# Patient Record
Sex: Female | Born: 1970 | Race: Black or African American | Hispanic: No | Marital: Married | Smoking: Never smoker
Health system: Southern US, Community
[De-identification: ages and names within clinical notes are randomized; demographics above are authoritative.]

## PROBLEM LIST (undated history)

## (undated) DIAGNOSIS — I10 Essential (primary) hypertension: Secondary | ICD-10-CM

## (undated) DIAGNOSIS — F329 Major depressive disorder, single episode, unspecified: Secondary | ICD-10-CM

## (undated) DIAGNOSIS — G4733 Obstructive sleep apnea (adult) (pediatric): Secondary | ICD-10-CM

## (undated) DIAGNOSIS — E119 Type 2 diabetes mellitus without complications: Secondary | ICD-10-CM

## (undated) DIAGNOSIS — F32A Depression, unspecified: Secondary | ICD-10-CM

## (undated) DIAGNOSIS — F419 Anxiety disorder, unspecified: Secondary | ICD-10-CM

## (undated) HISTORY — DX: Obstructive sleep apnea (adult) (pediatric): G47.33

## (undated) HISTORY — PX: ABDOMINAL HYSTERECTOMY: SHX81

## (undated) HISTORY — DX: Type 2 diabetes mellitus without complications: E11.9

## (undated) HISTORY — PX: BREAST BIOPSY: SHX20

## (undated) HISTORY — DX: Essential (primary) hypertension: I10

## (undated) HISTORY — PX: CHOLECYSTECTOMY: SHX55

## (undated) HISTORY — DX: Anxiety disorder, unspecified: F41.9

## (undated) HISTORY — DX: Depression, unspecified: F32.A

---
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
## 2018-09-06 ENCOUNTER — Other Ambulatory Visit: Payer: Self-pay | Admitting: Family Medicine

## 2018-09-06 DIAGNOSIS — Z1231 Encounter for screening mammogram for malignant neoplasm of breast: Secondary | ICD-10-CM

## 2018-09-12 ENCOUNTER — Encounter: Payer: Self-pay | Admitting: Neurology

## 2018-09-14 ENCOUNTER — Other Ambulatory Visit: Payer: Self-pay

## 2018-09-14 ENCOUNTER — Ambulatory Visit (INDEPENDENT_AMBULATORY_CARE_PROVIDER_SITE_OTHER): Payer: BC Managed Care – PPO | Admitting: Neurology

## 2018-09-14 ENCOUNTER — Encounter: Payer: Self-pay | Admitting: Neurology

## 2018-09-14 VITALS — BP 155/98 | HR 74 | Temp 96.0°F | Ht 69.0 in | Wt 303.0 lb

## 2018-09-14 DIAGNOSIS — Z9989 Dependence on other enabling machines and devices: Secondary | ICD-10-CM

## 2018-09-14 DIAGNOSIS — G4719 Other hypersomnia: Secondary | ICD-10-CM

## 2018-09-14 DIAGNOSIS — Z6841 Body Mass Index (BMI) 40.0 and over, adult: Secondary | ICD-10-CM

## 2018-09-14 DIAGNOSIS — G4733 Obstructive sleep apnea (adult) (pediatric): Secondary | ICD-10-CM

## 2018-09-14 NOTE — Progress Notes (Signed)
Subjective:    Patient ID: Brittany Giles is a 48 y.o. female.  HPI     [name], MD, PhD [facility] [address], [address] [address] [place], [place] [address] Dear Dr. Thomasena Edisollins, I saw you patient, Brittany Giles, upon your kind request in my sleep clinic today for initial consultation of her sleep disorder, in particular her prior diagnosis of sleep apnea for establishing care.  The patient is unaccompanied today.  As you know, Ms. Giles is a 48 year old right-handed woman with an underlying medical history of diabetes, anxiety, depression, hypertension, and morbid obesity with a BMI of over 40, who reports a diagnosis of obstructive sleep apnea since 2017.  Prior sleep study results Were reviewed today Through care everywhere.  She had a split-night sleep study on 01/16/2016 which showed a baseline AHI of 44/h, O2 nadir of 86%, she was titrated to 14 cm.  I reviewed your office note from 09/06/2018.  Her CBC with differential was unremarkable at the time.  She has been on metformin for diabetes and she takes generic Celexa 20 mg daily.  She has been on CPAP therapy.  She does not have a local DME provider yet.  I reviewed her compliance data from 08/16/2018 through 09/14/2018 which is a total of 30 days, during which time she used her CPAP machine every night with percent used days greater than 4 hours at 97%, indicating excellent compliance with an average usage of 5 hours and 44 minutes, residual AHI at goal at 3.3/h, leak is very high consistently with the 95th percentile at 120 L/min, pressure at 14 cm. Her Epworth sleepiness score is 19 out of 24, fatigue severity score is 46 out of 63. She reports that when she first started CPAP therapy it helped quite a bit.  She has had no recent supplies.  In fact, her full facemask she is using is about 48 years old she estimates.  She moved from [place] about a year ago.  [place] live in [place].  She lives with her husband  and 48-year-old son who has autism.  He sleeps in the bedroom with her, often in her bed with her, her husband sleeps in a different bedroom, husband uses a CPAP machine.  She reports a family history of sleep apnea in her brother and sister.  She recalls that she was told she had severe sleep apnea. 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  She drinks no caffeine on a daily basis.  She tries to hydrate well with water.  She reports that her most recent A1c was 11.  She has a follow-up appointment pending.  Bedtime is generally around 9 but she is often not asleep quickly.  Rise time when she teaches is around 530, currently no later than 7:15 AM when her son wakes up.  She does not have night to night nocturia.  Her weight has been fluctuating generally between 290 and 300 pounds.   Her Past Medical History Is Significant For: Past Medical History:  Diagnosis Date  . Anxiety   . Depression   . Diabetes mellitus without complication (HCC)    type 2  . Hypertension   . OSA on CPAP     Her Past Surgical History Is Significant For:   Her Family History Is Significant For: No family history on file.  Her Social History Is Significant For: Social History   Socioeconomic History  . Marital status: Married    Spouse name: Not  on file  . Number of children: Not on file  . Years of education: Not on file  . Highest education level: Not on file  Occupational History  . Not on file  Social Needs  . Financial resource strain: Not on file  . Food insecurity    Worry: Not on file    Inability: Not on file  . Transportation needs    Medical: Not on file    Non-medical: Not on file  Tobacco Use  . Smoking status: Never Smoker  . Smokeless tobacco: Never Used  Substance and Sexual Activity  . Alcohol use: Not Currently  . Drug use: Not Currently  . Sexual activity: Not on file  Lifestyle  . Physical activity    Days per week: Not on file    Minutes per session: Not on file  . Stress:  Not on file  Relationships  . Social Musicianconnections    Talks on phone: Not on file    Gets together: Not on file    Attends religious service: Not on file    Active member of club or organization: Not on file    Attends meetings of clubs or organizations: Not on file    Relationship status: Not on file  Other Topics Concern  . Not on file  Social History Narrative  . Not on file    Her Allergies Are:  No Known Allergies:   Her Current Medications Are:  Outpatient Encounter Medications as of 09/14/2018  Medication Sig  . Apple Cider Vinegar 500 MG TABS Take by mouth.  . citalopram (CELEXA) 20 MG tablet Take 20 mg by mouth daily.  Marland Kitchen. lisinopril (ZESTRIL) 20 MG tablet Take 20 mg by mouth daily.  . metFORMIN (GLUCOPHAGE) 500 MG tablet Take by mouth 2 (two) times daily with a meal.   No facility-administered encounter medications on file as of 09/14/2018.   :  Review of Systems:  Out of a complete 14 point review of systems, all are reviewed and negative with the exception of these symptoms as listed below: Review of Systems  Neurological:       Pt presents today. She states that she had a sleep study anbout 3 yrs ago. She started a CPAP and when she started initially it was very helpful. She states that she can feel something is different. She is needing to establish with sleep MD and also needs to be set up with a DME company.  Epworth Sleepiness Scale 0= would never doze 1= slight chance of dozing 2= moderate chance of dozing 3= high chance of dozing  Sitting and reading:3 Watching TV:2 Sitting inactive in a public place (ex. Theater or meeting):2 As a passenger in a car for an hour without a break:3 Lying down to rest in the afternoon:3 Sitting and talking to someone:2 Sitting quietly after lunch (no alcohol):2 In a car, while stopped in traffic:2 Total:19     Objective:  Neurological Exam  Physical Exam Physical Examination:   Vitals:   09/14/18 1455  BP: (!)  155/98  Pulse: 74  Temp: (!) 96 F (35.6 C)   General Examination: The patient is a very pleasant 48 y.o. female in no acute distress. She appears well-developed and well-nourished and well groomed.   HEENT: Normocephalic, atraumatic, pupils are equal, round and reactive to light and accommodation. Extraocular tracking is good without limitation to gaze excursion or nystagmus noted. Normal smooth pursuit is noted. Hearing is grossly intact. face is symmetric with normal  facial animation and normal facial sensation. Speech is clear with no dysarthria noted. There is no hypophonia. There is no lip, neck/head, jaw or voice tremor. Neck is supple with full range of passive and active motion. There are no carotid bruits on auscultation. Oropharynx exam reveals: mild mouth dryness, adequate dental hygiene and moderate airway crowding, due to Longer uvula, tonsils of 3+ on the right side and 2+ on the left, Mallampati is class I, tongue protrudes centrally in palate elevates symmetrically.   Chest: Clear to auscultation without wheezing, rhonchi or crackles noted.  Heart: S1+S2+0, regular and normal without murmurs, rubs or gallops noted.   Abdomen: Soft, non-tender and non-distended with normal bowel sounds appreciated on auscultation.  Extremities: There is no pitting edema in the distal lower extremities bilaterally. Pedal pulses are intact.  Skin: Warm and dry without trophic changes noted.  Musculoskeletal: exam reveals no obvious joint deformities, tenderness or joint swelling or erythema.   Neurologically:  Mental status: The patient is awake, alert and oriented in all 4 spheres. Her immediate and remote memory, attention, language skills and fund of knowledge are appropriate. There is no evidence of aphasia, agnosia, apraxia or anomia. Speech is clear with normal prosody and enunciation. Thought process is linear. Mood is normal and affect is normal.  Cranial nerves II - XII are as described  above under HEENT exam. In addition: shoulder shrug is normal with equal shoulder height noted. Motor exam: Normal bulk, strength and tone is noted. There is no drift, tremor or rebound. Romberg is negative. Fine motor skills and coordination: intact with normal finger taps, normal hand movements, normal rapid alternating patting, normal foot taps and normal foot agility.  Cerebellar testing: No dysmetria or intention tremor on finger to nose testing.  There is no truncal or gait ataxia.  Sensory exam: intact to light touch in the upper and lower extremities.  Gait, station and balance: She stands easily. No veering to one side is noted. No leaning to one side is noted. Posture is age-appropriate and stance is narrow based. Gait shows normal stride length and normal pace. No problems turning are noted. Slight genu valgus bilat.  Assessment and Plan:  In summary, Pearly Bartosik is a very pleasant 48 y.o.-year old female with an underlying medical history of diabetes, anxiety, depression, hypertension, and morbid obesity with a BMI of over 48, who Presents for evaluation of her prior diagnosis of obstructive sleep apnea.  She was diagnosed with severe sleep apnea in November 2017, she had a split-night sleep study.  She has been compliant with her CPAP and is commended for her treatment adherence, sleep apnea score is currently at goal below 5/h but she has a very high leak.  She does need new supplies and needs to establish with a DME company locally.  She and her family moved from [place] about a year ago.  We talked about the importance of compliance with CPAP therapy for sleep apnea management.  We also talked about the importance of good diabetes control and healthy lifestyle in general, weight management in particular.  She is encouraged to talk to about potentially seeing a weight management specialist.  She has an appointment pending with you to discuss her A1c and further management of her diabetes as  I understand. I had a long chat with the patient about my findings and the diagnosis of OSA, its prognosis and treatment options. We talked about medical treatments, surgical interventions and non-pharmacological approaches. I explained in particular  the risks and ramifications of untreated moderate to severe OSA, especially with respect to developing cardiovascular disease down the Road, including congestive heart failure, difficult to treat hypertension, cardiac arrhythmias, or stroke. Even type 2 diabetes has, in part, been linked to untreated OSA. Symptoms of untreated OSA include daytime sleepiness, memory problems, mood irritability and mood disorder such as depression and anxiety, lack of energy, as well as recurrent headaches, especially morning headaches.  I recommended the following at this time: Continue CPAP therapy at the current settings, we will try to get her supplies through a local DME company.  She is advised to try to make enough time for sleep, 7 to 8 hours if possible. She has a special needs child 17(9 yo son has autism and sleeps with her).  I Suggested a routine follow-up in 6 months, sooner if needed.  I answered all her questions today and she was in agreement. Thank you very much for allowing me to participate in the care of this nice patient. If I can be of any further assistance to you please do not hesitate to call me at [phone].  Sincerely,   Huston FoleySaima Tysen Roesler, MD, PhD

## 2018-09-14 NOTE — Patient Instructions (Signed)
Please continue using your CPAP regularly. While your insurance requires that you use CPAP at least 4 hours each night on 70% of the nights, I recommend, that you not skip any nights and use it throughout the night if you can. Getting used to CPAP and staying with the treatment long term does take time and patience and discipline. Untreated obstructive sleep apnea when it is moderate to severe can have an adverse impact on cardiovascular health and raise her risk for heart disease, arrhythmias, hypertension, congestive heart failure, stroke and diabetes. Untreated obstructive sleep apnea causes sleep disruption, nonrestorative sleep, and sleep deprivation. This can have an impact on your day to day functioning and cause daytime sleepiness and impairment of cognitive function, memory loss, mood disturbance, and problems focussing. Using CPAP regularly can improve these symptoms.  I will send your CPAP supply order to [facility]. Please call us mid next week, if you don't hear from them. The address for [facility] is: [address] [phone].

## 2018-10-16 ENCOUNTER — Ambulatory Visit
Admission: RE | Admit: 2018-10-16 | Discharge: 2018-10-16 | Disposition: A | Discharge status: Home / Self Care | Payer: BC Managed Care – PPO | Source: Ambulatory Visit | Attending: Family Medicine | Admitting: Family Medicine

## 2018-10-16 ENCOUNTER — Other Ambulatory Visit: Payer: Self-pay

## 2018-10-16 DIAGNOSIS — Z1231 Encounter for screening mammogram for malignant neoplasm of breast: Secondary | ICD-10-CM

## 2019-03-29 ENCOUNTER — Ambulatory Visit: Payer: BC Managed Care – PPO | Admitting: Neurology

## 2021-04-06 ENCOUNTER — Other Ambulatory Visit: Payer: Self-pay | Admitting: Family Medicine

## 2021-04-06 DIAGNOSIS — N644 Mastodynia: Secondary | ICD-10-CM

## 2021-04-11 ENCOUNTER — Other Ambulatory Visit: Payer: Self-pay | Admitting: Family Medicine

## 2021-04-11 DIAGNOSIS — N644 Mastodynia: Secondary | ICD-10-CM

## 2021-05-13 ENCOUNTER — Other Ambulatory Visit: Payer: BC Managed Care – PPO

## 2021-05-28 ENCOUNTER — Ambulatory Visit: Payer: Self-pay

## 2021-05-28 ENCOUNTER — Ambulatory Visit
Admission: RE | Admit: 2021-05-28 | Discharge: 2021-05-28 | Disposition: A | Discharge status: Home / Self Care | Payer: BC Managed Care – PPO | Source: Ambulatory Visit | Attending: Family Medicine | Admitting: Family Medicine

## 2021-05-28 ENCOUNTER — Other Ambulatory Visit: Payer: Self-pay

## 2021-08-08 ENCOUNTER — Ambulatory Visit (HOSPITAL_COMMUNITY)
Admission: EM | Admit: 2021-08-08 | Discharge: 2021-08-08 | Disposition: A | Discharge status: Home / Self Care | Payer: BC Managed Care – PPO | Attending: Internal Medicine | Admitting: Internal Medicine

## 2021-08-08 ENCOUNTER — Encounter (HOSPITAL_COMMUNITY): Payer: Self-pay | Admitting: *Deleted

## 2021-08-08 ENCOUNTER — Other Ambulatory Visit: Payer: Self-pay

## 2021-08-08 DIAGNOSIS — J020 Streptococcal pharyngitis: Secondary | ICD-10-CM | POA: Diagnosis not present

## 2021-08-08 LAB — POCT RAPID STREP A, ED / UC: Streptococcus, Group A Screen (Direct): POSITIVE — AB

## 2021-08-08 MED ORDER — AMOXICILLIN 250 MG/5ML PO SUSR: 500.0000 mg | Freq: Two times a day (BID) | ORAL | 0 refills | Status: AC

## 2021-08-08 MED ORDER — AMOXICILLIN 500 MG PO CAPS: 500.0000 mg | ORAL_CAPSULE | Freq: Two times a day (BID) | ORAL | 0 refills | Status: DC

## 2021-08-08 NOTE — Discharge Instructions (Signed)
Your rapid strep test today was positive  Take amoxicillin twice a day for the next 10 days and ideally should start to see improvement within 24 to 40 hours of medication use  Attempt salt water gargles, throat lozenges, warm liquids, soft foods, teaspoons of honey for additional comfort  You may attempt over-the-counter Imodium to help slow down her diarrhea, be mindful of overuse of this medicine will make you constipated  May use ibuprofen every 8 hours as needed in addition to Tylenol for additional comfort  You may follow-up at urgent care as needed

## 2021-08-08 NOTE — ED Triage Notes (Signed)
Pt reports sore throat and diarrhea for 3 days.

## 2021-08-08 NOTE — ED Provider Notes (Signed)
[facility]    CSN: [id number] Arrival date & time: 08/08/21  1720      History   Chief Complaint Chief Complaint  Patient presents with   Sore Throat   Diarrhea    HPI Brittany Giles is a 51 y.o. female.   Presents with sore throat, chills, bilateral ear pain, throat discomfort and diarrhea for 3 days.  Episode of diarrhea today.  Has attempted use of Coricidin which has been minimally helpful.  Tolerating small amounts of food and liquids.  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  History of diabetes, hypertension, sleep apnea, depression anxiety.    Past Medical History:  Diagnosis Date   Anxiety    Depression    Diabetes mellitus without complication (HCC)    type 2   Hypertension    OSA on CPAP     There are no problems to display for this patient.   Past Surgical History:  Procedure Laterality Date   ABDOMINAL HYSTERECTOMY     BREAST BIOPSY Left    2010 benign   CESAREAN SECTION  2011   CHOLECYSTECTOMY      OB History   No obstetric history on file.      Home Medications    Prior to Admission medications   Medication Sig Start Date End Date Taking? Authorizing Provider  Apple Cider Vinegar 500 MG TABS Take by mouth.    [provider]  citalopram (CELEXA) 20 MG tablet Take 20 mg by mouth daily.    [provider]  lisinopril (ZESTRIL) 20 MG tablet Take 20 mg by mouth daily.    [provider]  metFORMIN (GLUCOPHAGE) 500 MG tablet Take by mouth 2 (two) times daily with a meal.    [provider]    Family History Family History  Problem Relation Age of Onset   Breast cancer Maternal Grandmother 26    Social History Social History   Tobacco Use   Smoking status: Never   Smokeless tobacco: Never  Substance Use Topics   Alcohol use: Not Currently   Drug use: Not Currently     Allergies   Patient has no known allergies.   Review of Systems Review of Systems   Constitutional:  Positive for chills. Negative for activity change, appetite change, diaphoresis, fatigue, fever and unexpected weight change.  HENT:  Positive for ear pain and sore throat. Negative for congestion, dental problem, drooling, ear discharge, facial swelling, hearing loss, mouth sores, nosebleeds, postnasal drip, rhinorrhea, sinus pressure, sinus pain, sneezing, tinnitus, trouble swallowing and voice change.   Respiratory: Negative.    Cardiovascular: Negative.   Gastrointestinal:  Positive for diarrhea. Negative for abdominal distention, abdominal pain, anal bleeding, blood in stool, constipation, nausea, rectal pain and vomiting.  Skin: Negative.     Physical Exam Triage Vital Signs ED Triage Vitals  Enc Vitals Group     BP 08/08/21 1728 140/75     Pulse Rate 08/08/21 1728 98     Resp 08/08/21 1728 18     Temp 08/08/21 1728 98.9 F (37.2 C)     Temp src --      SpO2 08/08/21 1728 98 %     Weight --      Height --      Head Circumference --      Peak Flow --      Pain Score 08/08/21 1726 8     Pain Loc --  Pain Edu? --      Excl. in GC? --    No data found.  Updated Vital Signs BP 140/75   Pulse 98   Temp 98.9 F (37.2 C)   Resp 18   SpO2 98%   Visual Acuity Right Eye Distance:   Left Eye Distance:   Bilateral Distance:    Right Eye Near:   Left Eye Near:    Bilateral Near:     Physical Exam Constitutional:      Appearance: Normal appearance. She is well-developed.  HENT:     Head: Normocephalic.     Right Ear: Tympanic membrane and ear canal normal.     Left Ear: Tympanic membrane and ear canal normal.     Nose: Congestion present.     Mouth/Throat:     Mouth: Mucous membranes are moist.     Pharynx: Oropharynx is clear.     Tonsils: Tonsillar exudate present. 2+ on the right. 2+ on the left.  Eyes:     Extraocular Movements: Extraocular movements intact.  Cardiovascular:     Rate and Rhythm: Normal rate and regular rhythm.      Pulses: Normal pulses.     Heart sounds: Normal heart sounds.  Pulmonary:     Effort: Pulmonary effort is normal.     Breath sounds: Normal breath sounds.  Musculoskeletal:        General: Normal range of motion.     Cervical back: Normal range of motion.  Lymphadenopathy:     Cervical: Cervical adenopathy present.  Skin:    General: Skin is warm and dry.  Neurological:     General: No focal deficit present.     Mental Status: She is alert and oriented to person, place, and time.  Psychiatric:        Mood and Affect: Mood normal.        Behavior: Behavior normal.     UC Treatments / Results  Labs (all labs ordered are listed, but only abnormal results are displayed) Labs Reviewed  POCT RAPID STREP A, ED / UC - Abnormal; Notable for the following components:      Result Value   Streptococcus, Group A Screen (Direct) POSITIVE (*)    All other components within normal limits    EKG   Radiology No results found.  Procedures Procedures (including critical care time)  Medications Ordered in UC Medications - No data to display  Initial Impression / Assessment and Plan / UC Course  I have reviewed the triage vital signs and the nursing notes.  Pertinent labs & imaging results that were available during my care of the patient were reviewed by me and considered in my medical decision making (see chart for details).  Pharyngitis  Confirmed by rapid test, discussed with patient, amoxicillin 10-day course prescribed, may use additional over-the-counter medications for supportive care, recommend over-the-counter Imodium to help slow diarrhea, may follow-up with urgent care as needed for persistent or reoccurring symptoms Final Clinical Impressions(s) / UC Diagnoses   Final diagnoses:  None   Discharge Instructions   None    ED Prescriptions   None    PDMP not reviewed this encounter.   Valinda Hoar, NP 08/08/21 1815

## 2022-10-11 IMAGING — MG DIGITAL DIAGNOSTIC BILAT W/ TOMO W/ CAD
8 of 14 series · 8 of 40 positions shown · non-contrast
Comparison: Previous exam(s).

ACR Breast Density Category a: The breast tissue is almost entirely
fatty.

CLINICAL DATA: Nonfocal left breast pain beginning 2 months ago and
lasting 2 weeks. She has not had any pain in the past 1.5 months.

EXAM:
DIGITAL DIAGNOSTIC BILATERAL MAMMOGRAM WITH TOMOSYNTHESIS AND CAD
TECHNIQUE: Bilateral digital diagnostic mammography and breast tomosynthesis
was performed. The images were evaluated with computer-aided
detection.

[R XCCL synth-2D]
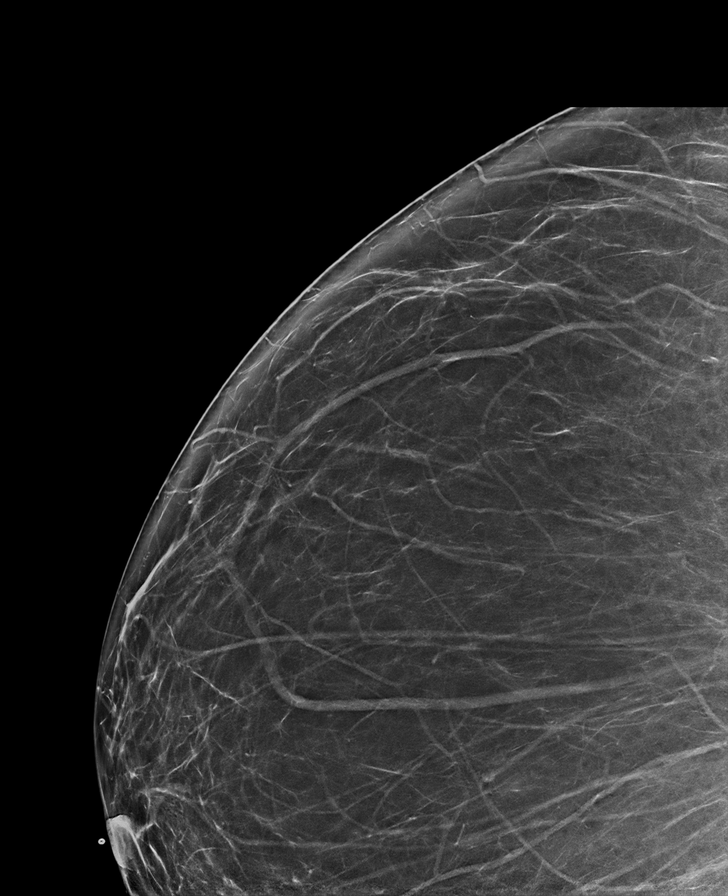

[L CC synth-2D]
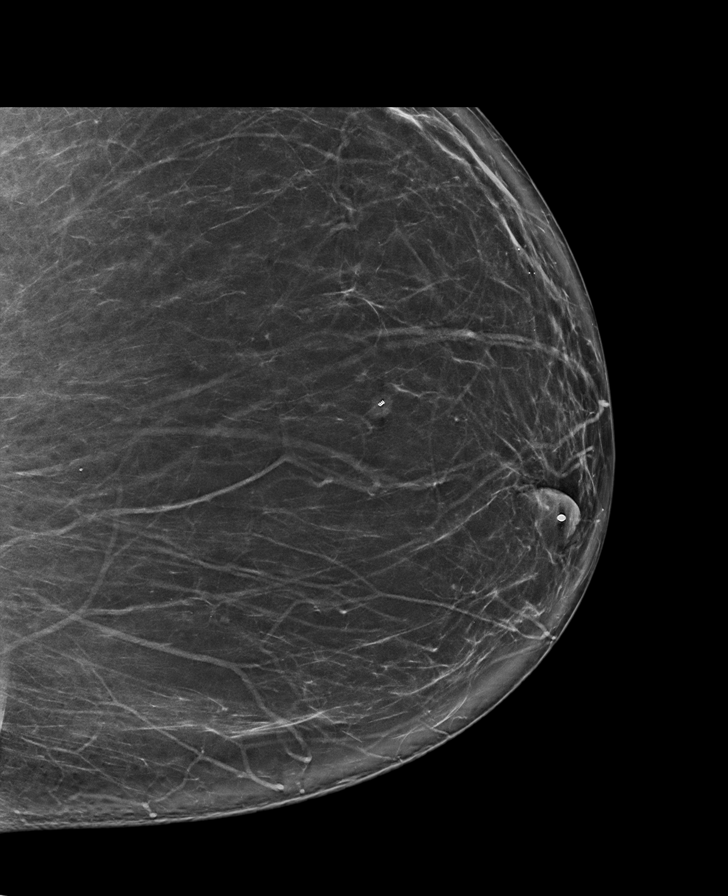

[R MLO synth-2D (1 of 2)]
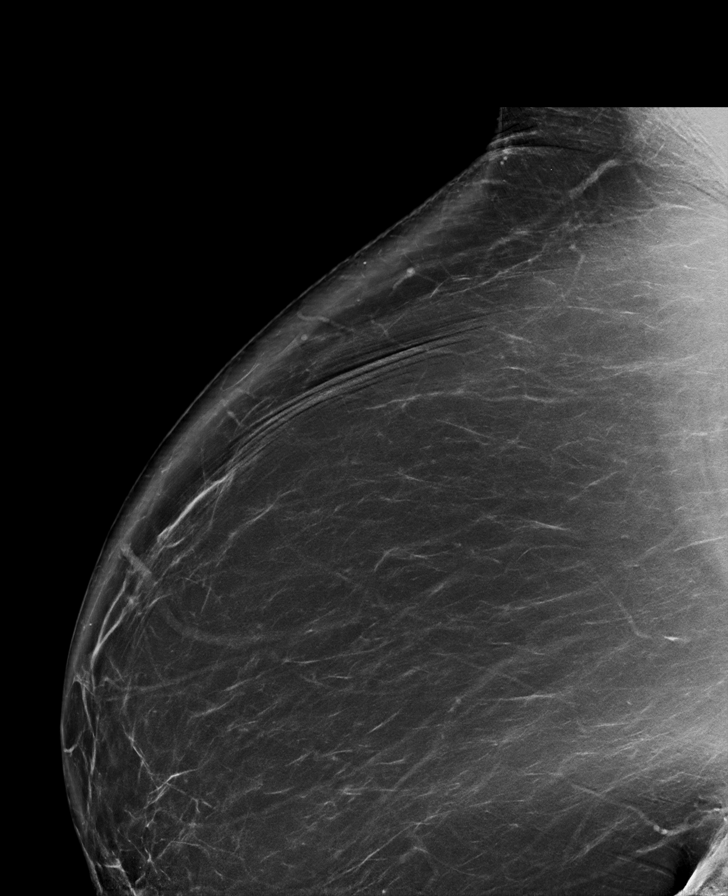

[R CC synth-2D]
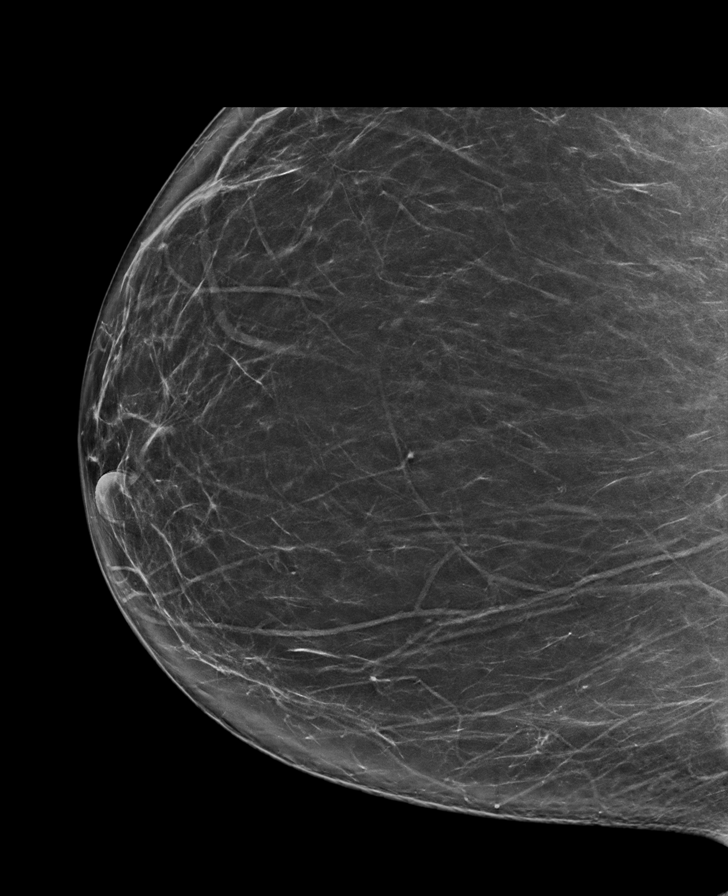

[L MLO synth-2D]
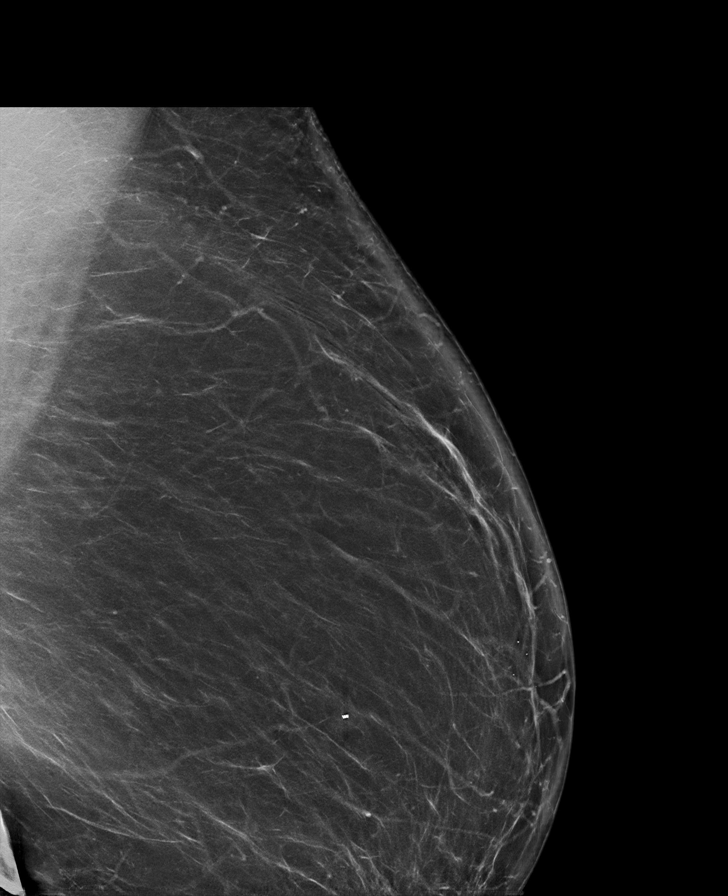

[R MLO synth-2D (2 of 2)]
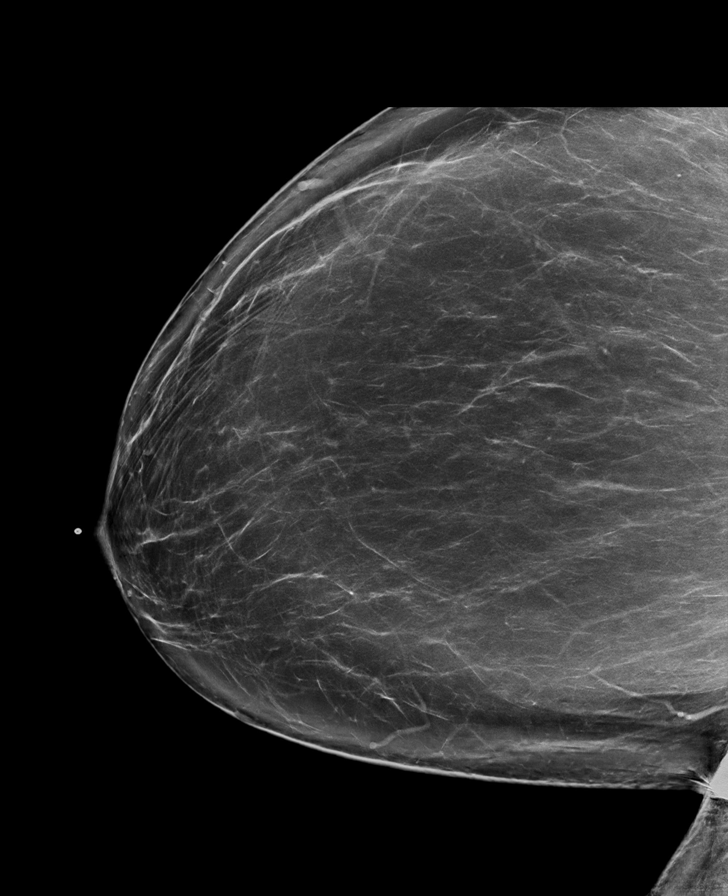

[L XCCL synth-2D]
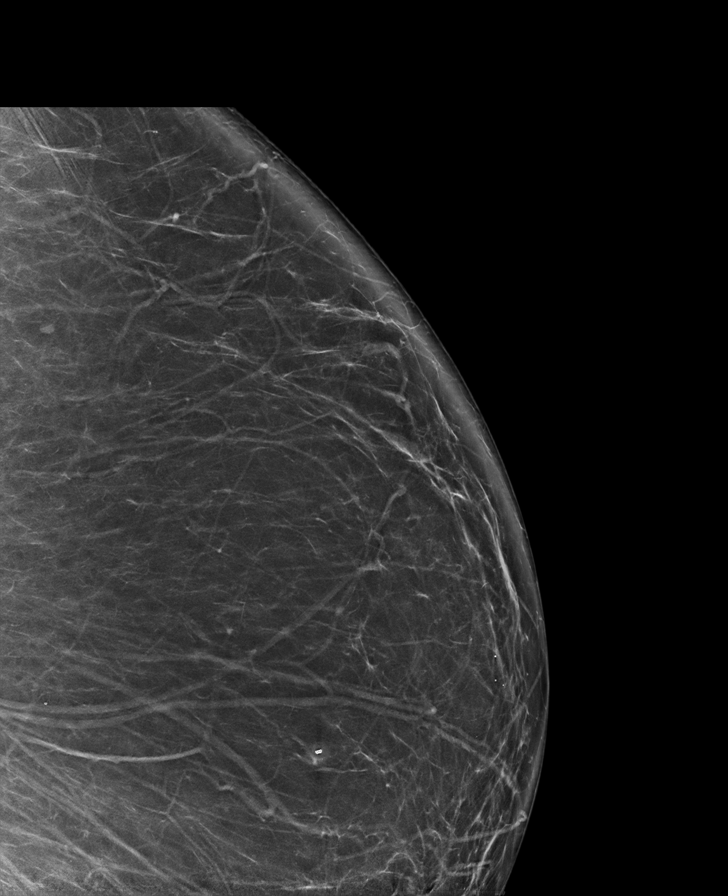

[R MLO tomo · tomo slice 55/108.0]
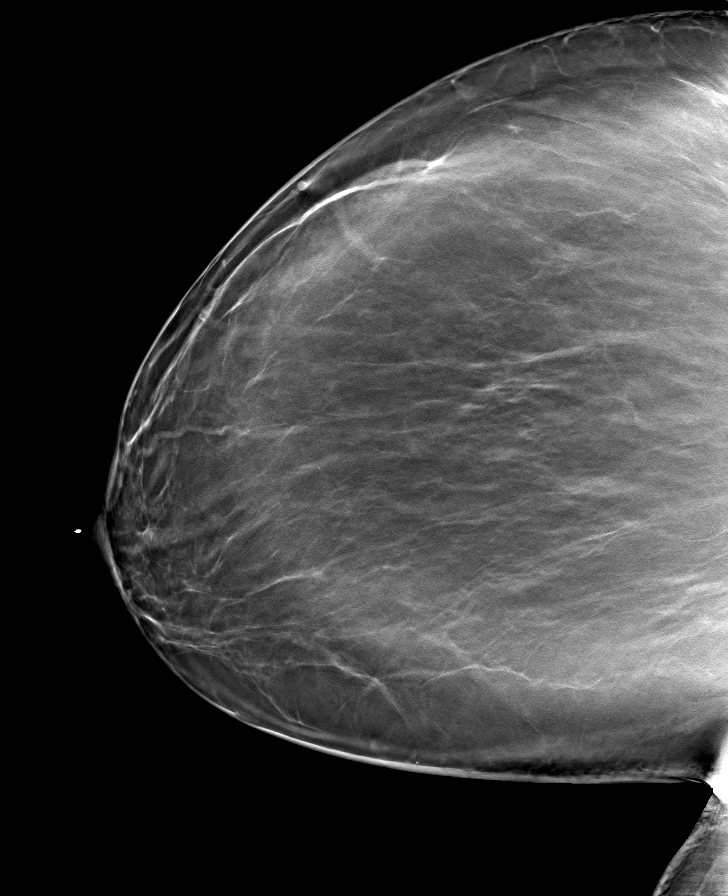

[8 of 40 positions shown; findings below may reference images not displayed]

FINDINGS: Stable mammographic appearance of the breasts with no findings
suspicious for malignancy in either breast.
IMPRESSION: No evidence of malignancy.

RECOMMENDATION:
Bilateral screening mammogram in year.

I have discussed the findings and recommendations with the patient.
If applicable, a reminder letter will be sent to the patient
regarding the next appointment.

BI-RADS CATEGORY  1: Negative.

## 2022-12-26 ENCOUNTER — Encounter (HOSPITAL_COMMUNITY): Payer: Self-pay | Admitting: Pharmacy Technician

## 2022-12-26 ENCOUNTER — Other Ambulatory Visit: Payer: Self-pay

## 2022-12-26 ENCOUNTER — Inpatient Hospital Stay (HOSPITAL_COMMUNITY)
Admission: EM | Admit: 2022-12-26 | Discharge: 2022-12-29 | DRG: 175 | Disposition: A | Discharge status: Home / Self Care | Payer: BC Managed Care – PPO | Attending: Internal Medicine | Admitting: Internal Medicine

## 2022-12-26 ENCOUNTER — Emergency Department (HOSPITAL_COMMUNITY): Payer: BC Managed Care – PPO

## 2022-12-26 DIAGNOSIS — E1165 Type 2 diabetes mellitus with hyperglycemia: Secondary | ICD-10-CM | POA: Diagnosis present

## 2022-12-26 DIAGNOSIS — Z79899 Other long term (current) drug therapy: Secondary | ICD-10-CM

## 2022-12-26 DIAGNOSIS — E119 Type 2 diabetes mellitus without complications: Secondary | ICD-10-CM

## 2022-12-26 DIAGNOSIS — F32A Depression, unspecified: Secondary | ICD-10-CM | POA: Diagnosis present

## 2022-12-26 DIAGNOSIS — E1159 Type 2 diabetes mellitus with other circulatory complications: Secondary | ICD-10-CM | POA: Diagnosis not present

## 2022-12-26 DIAGNOSIS — E1169 Type 2 diabetes mellitus with other specified complication: Secondary | ICD-10-CM | POA: Diagnosis present

## 2022-12-26 DIAGNOSIS — Z6841 Body Mass Index (BMI) 40.0 and over, adult: Secondary | ICD-10-CM | POA: Diagnosis not present

## 2022-12-26 DIAGNOSIS — I152 Hypertension secondary to endocrine disorders: Secondary | ICD-10-CM | POA: Diagnosis present

## 2022-12-26 DIAGNOSIS — G4733 Obstructive sleep apnea (adult) (pediatric): Secondary | ICD-10-CM | POA: Insufficient documentation

## 2022-12-26 DIAGNOSIS — J9601 Acute respiratory failure with hypoxia: Secondary | ICD-10-CM | POA: Diagnosis present

## 2022-12-26 DIAGNOSIS — Z7984 Long term (current) use of oral hypoglycemic drugs: Secondary | ICD-10-CM | POA: Diagnosis not present

## 2022-12-26 DIAGNOSIS — Z91148 Patient's other noncompliance with medication regimen for other reason: Secondary | ICD-10-CM | POA: Diagnosis not present

## 2022-12-26 DIAGNOSIS — T383X6A Underdosing of insulin and oral hypoglycemic [antidiabetic] drugs, initial encounter: Secondary | ICD-10-CM | POA: Diagnosis present

## 2022-12-26 DIAGNOSIS — R0602 Shortness of breath: Secondary | ICD-10-CM | POA: Diagnosis present

## 2022-12-26 DIAGNOSIS — Z90710 Acquired absence of both cervix and uterus: Secondary | ICD-10-CM | POA: Diagnosis not present

## 2022-12-26 DIAGNOSIS — F419 Anxiety disorder, unspecified: Secondary | ICD-10-CM | POA: Diagnosis present

## 2022-12-26 DIAGNOSIS — I2609 Other pulmonary embolism with acute cor pulmonale: Secondary | ICD-10-CM | POA: Diagnosis not present

## 2022-12-26 DIAGNOSIS — I2699 Other pulmonary embolism without acute cor pulmonale: Principal | ICD-10-CM | POA: Diagnosis present

## 2022-12-26 DIAGNOSIS — Z86711 Personal history of pulmonary embolism: Secondary | ICD-10-CM | POA: Diagnosis not present

## 2022-12-26 DIAGNOSIS — R000 Tachycardia, unspecified: Secondary | ICD-10-CM | POA: Diagnosis present

## 2022-12-26 DIAGNOSIS — E876 Hypokalemia: Secondary | ICD-10-CM | POA: Diagnosis present

## 2022-12-26 LAB — BASIC METABOLIC PANEL
Anion gap: 11 (ref 5–15)
BUN: 12 mg/dL (ref 6–20)
CO2: 25 mmol/L (ref 22–32)
Calcium: 9.5 mg/dL (ref 8.9–10.3)
Chloride: 100 mmol/L (ref 98–111)
Creatinine, Ser: 0.93 mg/dL (ref 0.44–1.00)
GFR, Estimated: >60 mL/min (ref >60)
Glucose, Bld: 227 mg/dL — ABNORMAL HIGH (ref 70–99)
Potassium: 3.4 mmol/L — ABNORMAL LOW (ref 3.5–5.1)
Sodium: 136 mmol/L (ref 135–145)

## 2022-12-26 LAB — LACTIC ACID, PLASMA: Lactic Acid, Venous: 1.8 mmol/L (ref 0.5–1.9)

## 2022-12-26 LAB — CBC
HCT: 39.5 % (ref 36.0–46.0)
Hemoglobin: 12.4 g/dL (ref 12.0–15.0)
MCH: 28.1 pg (ref 26.0–34.0)
MCHC: 31.4 g/dL (ref 30.0–36.0)
MCV: 89.4 fL (ref 80.0–100.0)
Platelets: 208 10*3/uL (ref 150–400)
RBC: 4.42 MIL/uL (ref 3.87–5.11)
RDW: 15.3 % (ref 11.5–15.5)
WBC: 9 10*3/uL (ref 4.0–10.5)
nRBC: 0 % (ref 0.0–0.2)

## 2022-12-26 LAB — TROPONIN I (HIGH SENSITIVITY)
Troponin I (High Sensitivity): 5 ng/L (ref <18)
Troponin I (High Sensitivity): 26 ng/L — ABNORMAL HIGH (ref <18)
Troponin I (High Sensitivity): 72 ng/L — ABNORMAL HIGH (ref <18)

## 2022-12-26 LAB — APTT: aPTT: 25 s (ref 24–36)

## 2022-12-26 LAB — BRAIN NATRIURETIC PEPTIDE
B Natriuretic Peptide: 18.8 pg/mL (ref 0.0–100.0)
B Natriuretic Peptide: 37.8 pg/mL (ref 0.0–100.0)

## 2022-12-26 LAB — PROTIME-INR
INR: 1.1 (ref 0.8–1.2)
Prothrombin Time: 13.9 s (ref 11.4–15.2)

## 2022-12-26 LAB — GLUCOSE, CAPILLARY: Glucose-Capillary: 185 mg/dL — ABNORMAL HIGH (ref 70–99)

## 2022-12-26 LAB — CBG MONITORING, ED: Glucose-Capillary: 178 mg/dL — ABNORMAL HIGH (ref 70–99)

## 2022-12-26 LAB — MRSA NEXT GEN BY PCR, NASAL: MRSA by PCR Next Gen: NOT DETECTED

## 2022-12-26 MED ORDER — ONDANSETRON HCL 4 MG/2ML IJ SOLN: 4.0000 mg | Freq: Four times a day (QID) | INTRAMUSCULAR | Status: DC | PRN

## 2022-12-26 MED ORDER — OXYCODONE HCL 5 MG PO TABS: 5.0000 mg | ORAL_TABLET | ORAL | Status: DC | PRN

## 2022-12-26 MED ORDER — ONDANSETRON HCL 4 MG PO TABS: 4.0000 mg | ORAL_TABLET | Freq: Four times a day (QID) | ORAL | Status: DC | PRN

## 2022-12-26 MED ORDER — ACETAMINOPHEN 500 MG PO TABS
1000.0000 mg | ORAL_TABLET | Freq: Four times a day (QID) | ORAL | Status: DC | PRN
  Administered 2022-12-26 – 2022-12-28 (×2): 1000 mg via ORAL
  Filled 2022-12-26 (×2): qty 2

## 2022-12-26 MED ORDER — HEPARIN (PORCINE) 25000 UT/250ML-% IV SOLN
1500.0000 [IU]/h | INTRAVENOUS | Status: DC
  Administered 2022-12-26: 1800 [IU]/h via INTRAVENOUS
  Administered 2022-12-27: 1500 [IU]/h via INTRAVENOUS
  Filled 2022-12-26 (×2): qty 250

## 2022-12-26 MED ORDER — SENNOSIDES-DOCUSATE SODIUM 8.6-50 MG PO TABS: 1.0000 | ORAL_TABLET | Freq: Every evening | ORAL | Status: DC | PRN

## 2022-12-26 MED ORDER — INSULIN ASPART 100 UNIT/ML IJ SOLN
0.0000 [IU] | Freq: Three times a day (TID) | INTRAMUSCULAR | Status: DC
  Administered 2022-12-27: 1 [IU] via SUBCUTANEOUS
  Administered 2022-12-27: 3 [IU] via SUBCUTANEOUS
  Administered 2022-12-27: 1 [IU] via SUBCUTANEOUS
  Administered 2022-12-28: 2 [IU] via SUBCUTANEOUS
  Administered 2022-12-28: 1 [IU] via SUBCUTANEOUS
  Administered 2022-12-28 – 2022-12-29 (×2): 2 [IU] via SUBCUTANEOUS
  Filled 2022-12-26: qty 0.09

## 2022-12-26 MED ORDER — CHLORHEXIDINE GLUCONATE CLOTH 2 % EX PADS
6.0000 | MEDICATED_PAD | Freq: Every day | CUTANEOUS | Status: DC
  Administered 2022-12-26 – 2022-12-29 (×3): 6 via TOPICAL

## 2022-12-26 MED ORDER — ACETAMINOPHEN 650 MG RE SUPP: 650.0000 mg | Freq: Four times a day (QID) | RECTAL | Status: DC | PRN

## 2022-12-26 MED ORDER — INSULIN ASPART 100 UNIT/ML IJ SOLN
0.0000 [IU] | Freq: Every day | INTRAMUSCULAR | Status: DC
  Filled 2022-12-26: qty 0.05

## 2022-12-26 MED ORDER — SODIUM CHLORIDE 0.9% FLUSH
3.0000 mL | Freq: Two times a day (BID) | INTRAVENOUS | Status: DC
  Administered 2022-12-26 – 2022-12-28 (×4): 3 mL via INTRAVENOUS

## 2022-12-26 MED ORDER — HYDRALAZINE HCL 20 MG/ML IJ SOLN: 10.0000 mg | INTRAMUSCULAR | Status: DC | PRN

## 2022-12-26 MED ORDER — POTASSIUM CHLORIDE 20 MEQ PO PACK
40.0000 meq | PACK | Freq: Once | ORAL | Status: AC
  Administered 2022-12-26: 40 meq via ORAL
  Filled 2022-12-26: qty 2

## 2022-12-26 MED ORDER — HEPARIN BOLUS VIA INFUSION
3000.0000 [IU] | Freq: Once | INTRAVENOUS | Status: AC
  Administered 2022-12-26: 3000 [IU] via INTRAVENOUS
  Filled 2022-12-26: qty 3000

## 2022-12-26 MED ORDER — ACETAMINOPHEN 500 MG PO TABS
1000.0000 mg | ORAL_TABLET | Freq: Once | ORAL | Status: AC
  Administered 2022-12-26: 1000 mg via ORAL
  Filled 2022-12-26: qty 2

## 2022-12-26 MED ORDER — IOHEXOL 350 MG/ML SOLN
75.0000 mL | Freq: Once | INTRAVENOUS | Status: AC | PRN
  Administered 2022-12-26: 75 mL via INTRAVENOUS

## 2022-12-26 MED ORDER — ORAL CARE MOUTH RINSE: 15.0000 mL | OROMUCOSAL | Status: DC | PRN

## 2022-12-26 NOTE — ED Notes (Signed)
ED TO INPATIENT HANDOFF REPORT  Name/Age/Gender Brittany Giles 52 y.o. female  Code Status    Code Status Orders  (From admission, onward)           Start     Ordered   12/26/22 1909  Full code  Continuous       Question:  By:  Answer:  Consent: discussion documented in EHR   12/26/22 1909           Code Status History     This patient has a current code status but no historical code status.       Home/SNF/Other Home  Chief Complaint Bilateral pulmonary embolism (HCC) [I26.99]  Level of Care/Admitting Diagnosis ED Disposition     ED Disposition  Admit   Condition  --   Comment  Hospital Area: [facility] [[id number]]  Level of Care: Stepdown [14]  Admit to SDU based on following criteria: Respiratory Distress:  Frequent assessment and/or intervention to maintain adequate ventilation/respiration, pulmonary toilet, and respiratory treatment.  May admit patient to [facility] or [facility] if equivalent level of care is available:: No  Covid Evaluation: Asymptomatic - no recent exposure (last 10 days) testing not required  Diagnosis: Bilateral pulmonary embolism [facility]) [[id number]]  Admitting Physician: Charlsie Quest [[id number]]  Attending Physician: Charlsie Quest [[id number]]  Certification:: I certify this patient will need inpatient services for at least 2 midnights  Expected Medical Readiness: 12/29/2022          Medical History Past Medical History:  Diagnosis Date   Anxiety    Depression    Diabetes mellitus without complication (HCC)    type 2   Hypertension    OSA on CPAP     Allergies No Known Allergies  IV Location/Drains/Wounds Patient Lines/Drains/Airways Status     Active Line/Drains/Airways     Name Placement date Placement time Site Days   Peripheral IV 12/26/22 20 G Right Antecubital 12/26/22  1645  Antecubital  less than 1            Labs/Imaging Results for orders placed or performed during  the hospital encounter of 12/26/22 (from the past 48 hour(s))  CBG monitoring, ED     Status: Abnormal   Collection Time: 12/26/22  3:57 PM  Result Value Ref Range   Glucose-Capillary 178 (H) 70 - 99 mg/dL    Comment: Glucose reference range applies only to samples taken after fasting for at least 8 hours.  Basic metabolic panel     Status: Abnormal   Collection Time: 12/26/22  4:44 PM  Result Value Ref Range   Sodium 136 135 - 145 mmol/L   Potassium 3.4 (L) 3.5 - 5.1 mmol/L   Chloride 100 98 - 111 mmol/L   CO2 25 22 - 32 mmol/L   Glucose, Bld 227 (H) 70 - 99 mg/dL    Comment: Glucose reference range applies only to samples taken after fasting for at least 8 hours.   BUN 12 6 - 20 mg/dL   Creatinine, Ser 2.95 0.44 - 1.00 mg/dL   Calcium 9.5 8.9 - 62.1 mg/dL   GFR, Estimated >30 >86 mL/min    Comment: (NOTE) Calculated using the CKD-EPI Creatinine Equation (2021)    Anion gap 11 5 - 15    Comment: Performed at [facility], [address] [place], [place] [address]  CBC     Status: None   Collection Time: 12/26/22  4:44 PM  Result Value Ref Range   WBC 9.0 4.0 - 10.5 K/uL   RBC 4.42 3.87 - 5.11 MIL/uL   Hemoglobin 12.4 12.0 - 15.0 g/dL   HCT 16.1 09.6 - 04.5 %   MCV 89.4 80.0 - 100.0 fL   MCH 28.1 26.0 - 34.0 pg   MCHC 31.4 30.0 - 36.0 g/dL   RDW 40.9 81.1 - 91.4 %   Platelets 208 150 - 400 K/uL   nRBC 0.0 0.0 - 0.2 %    Comment: Performed at [facility], [address], [place], [place] [address]  Troponin I (High Sensitivity)     Status: None   Collection Time: 12/26/22  4:44 PM  Result Value Ref Range   Troponin I (High Sensitivity) 5 <18 ng/L    Comment: (NOTE) Elevated high sensitivity troponin I (hsTnI) values and significant  changes across serial measurements may suggest ACS but many other  chronic and acute conditions are known to elevate hsTnI results.  Refer to the "Links" section for chest pain algorithms and additional   guidance. Performed at [facility], [address] [place], [place] [address]   Brain natriuretic peptide     Status: None   Collection Time: 12/26/22  4:45 PM  Result Value Ref Range   B Natriuretic Peptide 18.8 0.0 - 100.0 pg/mL    Comment: Performed at [facility], [address] [place], [place] [address]  Troponin I (High Sensitivity)     Status: Abnormal   Collection Time: 12/26/22  6:12 PM  Result Value Ref Range   Troponin I (High Sensitivity) 26 (H) <18 ng/L    Comment: DELTA CHECK NOTED CRITICAL RESULT CALLED TO, READ BACK BY AND VERIFIED WITH FEVRIER C. @1916  12/26/2022 MCLEAN K. (NOTE) Elevated high sensitivity troponin I (hsTnI) values and significant  changes across serial measurements may suggest ACS but many other  chronic and acute conditions are known to elevate hsTnI results.  Refer to the "Links" section for chest pain algorithms and additional  guidance. Performed at [facility], [address], [place], [place] [address]   Protime-INR     Status: None   Collection Time: 12/26/22  6:46 PM  Result Value Ref Range   Prothrombin Time 13.9 11.4 - 15.2 seconds   INR 1.1 0.8 - 1.2    Comment: (NOTE) INR goal varies based on device and disease states. Performed at [facility], [address], [place], [place] [address]   APTT     Status: None   Collection Time: 12/26/22  6:46 PM  Result Value Ref Range   aPTT 25 24 - 36 seconds    Comment: Performed at [facility], [address], [place], [place] [address]   CT Angio Chest PE W and/or Wo Contrast  Result Date: 12/26/2022 CLINICAL DATA:  Shortness of breath and chest pain. Hyperglycemia. Concern for pulmonary embolism. EXAM: CT ANGIOGRAPHY CHEST WITH CONTRAST TECHNIQUE: Multidetector CT imaging of the chest was performed using the standard protocol during bolus administration of intravenous contrast.  Multiplanar CT image reconstructions and MIPs were obtained to evaluate the vascular anatomy. RADIATION DOSE REDUCTION: This exam was performed according to the departmental dose-optimization program which includes automated exposure control, adjustment of the mA and/or kV according to patient size and/or use of iterative reconstruction technique. CONTRAST:  75mL OMNIPAQUE IOHEXOL 350 MG/ML SOLN COMPARISON:  Chest radiograph dated 12/26/2022. FINDINGS: Evaluation is limited due to streak artifact  caused by patient's arms. Cardiovascular: Top-normal cardiac size. No pericardial effusion. The thoracic aorta is unremarkable. Bilateral pulmonary artery emboli involving the lobar branches of the upper and lower lobes. There is right heart straining with RV/LV ratio in the range of 1.1-1.3. Mediastinum/Nodes: No hilar or mediastinal adenopathy. The esophagus is grossly unremarkable. No mediastinal fluid collection. Lungs/Pleura: There is an 8 mm nodule in the left upper lobe versus an area of developing infarct. No consolidative changes. There is no pleural effusion or pneumothorax. The central airways are patent. Upper Abdomen: Fatty liver.  Cholecystectomy. Musculoskeletal: Degenerative changes of the spine. No acute osseous pathology. Review of the MIP images confirms the above findings. IMPRESSION: 1. Positive for acute PE with CT evidence of right heart strain (RV/LV Ratio = 1.2) consistent with at least submassive (intermediate risk) PE. The presence of right heart strain has been associated with an increased risk of morbidity and mortality. Please refer to the "Code PE Focused" order set in EPIC. 2. An 8 mm nodule in the left upper lobe versus an area of developing infarct. Non-contrast chest CT at 6-12 months is recommended. If the nodule is stable at time of repeat CT, then future CT at 18-24 months (from today's scan) is considered optional for low-risk patients, but is recommended for high-risk patients. This  recommendation follows the consensus statement: Guidelines for Management of Incidental Pulmonary Nodules Detected on CT Images: From the Fleischner Society 2017; Radiology 2017; 284:228-243. 3. Fatty liver. These results were called by telephone at the time of interpretation on 12/26/2022 at 6:10 pm to provider [facility] [email or web address] , who verbally acknowledged these results. Electronically Signed   By: Elgie Collard M.D.   On: 12/26/2022 18:13   DG Chest 2 View  Result Date: 12/26/2022 CLINICAL DATA:  Chest pain, shortness of breath EXAM: CHEST - 2 VIEW COMPARISON:  None Available. FINDINGS: The heart size and mediastinal contours are within normal limits. Both lungs are clear. The visualized skeletal structures are unremarkable. IMPRESSION: No acute abnormality of the lungs. Electronically Signed   By: Jearld Lesch M.D.   On: 12/26/2022 17:17    Pending Labs Unresulted Labs (From admission, onward)     Start     Ordered   12/27/22 0500  CBC  Daily,   R      12/26/22 1848   12/27/22 0500  HIV Antibody (routine testing w rflx)  (HIV Antibody (Routine testing w reflex) panel)  Tomorrow morning,   R        12/26/22 1909   12/27/22 0500  Basic metabolic panel  Tomorrow morning,   R        12/26/22 1909   12/27/22 0500  Hemoglobin A1c  Tomorrow morning,   R       Comments: To assess prior glycemic control    12/26/22 1910   12/27/22 0200  Heparin level (unfractionated)  Once-Timed,   TIMED        12/26/22 1848            Vitals/Pain Today's Vitals   12/26/22 1612 12/26/22 1700 12/26/22 1800 12/26/22 1900  BP:  (!) 156/98 (!) 163/101 (!) 172/115  Pulse: (!) 101 (!) 106 (!) 107 (!) 113  Resp: 16 18 18  (!) 21  Temp:    99.2 F (37.3 C)  TempSrc:    Oral  SpO2: 91% 95% 98% 96%  Weight:      Height:      PainSc:  Isolation Precautions No active isolations  Medications Medications  heparin ADULT infusion 100 units/mL (25000 units/230mL) (1,800 Units/hr Intravenous New  Bag/Given 12/26/22 1914)  sodium chloride flush (NS) 0.9 % injection 3 mL (has no administration in time range)  acetaminophen (TYLENOL) tablet 1,000 mg (has no administration in time range)    Or  acetaminophen (TYLENOL) suppository 650 mg (has no administration in time range)  oxyCODONE (Oxy IR/ROXICODONE) immediate release tablet 5 mg (has no administration in time range)  ondansetron (ZOFRAN) tablet 4 mg (has no administration in time range)    Or  ondansetron (ZOFRAN) injection 4 mg (has no administration in time range)  senna-docusate (Senokot-S) tablet 1 tablet (has no administration in time range)  insulin aspart (novoLOG) injection 0-9 Units (has no administration in time range)  insulin aspart (novoLOG) injection 0-5 Units (has no administration in time range)  hydrALAZINE (APRESOLINE) injection 10 mg (has no administration in time range)  iohexol (OMNIPAQUE) 350 MG/ML injection 75 mL (75 mLs Intravenous Contrast Given 12/26/22 1745)  acetaminophen (TYLENOL) tablet 1,000 mg (1,000 mg Oral Given 12/26/22 1837)  heparin bolus via infusion 3,000 Units (3,000 Units Intravenous Bolus from Bag 12/26/22 1913)  potassium chloride (KLOR-CON) packet 40 mEq (40 mEq Oral Given 12/26/22 1918)    Mobility non-ambulatory

## 2022-12-26 NOTE — H&P (Signed)
History and Physical    Brittany Giles ZOX:[id number] DOB: 1970-10-24 DOA: 12/26/2022  PCP: Irena Reichmann, DO  Patient coming from: Home  I have personally briefly reviewed patient's old medical records in [facility]  Chief Complaint: Chest pain, shortness of breath  HPI: Brittany Giles is a 52 y.o. female with medical history significant for T2DM, HTN, depression/anxiety, OSA not using CPAP who presented to the ED for evaluation of chest pain and shortness of breath.  Patient reports that she has been experiencing significant shortness of breath and chest discomfort for the last 2 days.  Dyspnea occurs anytime she stands up or moves.  She reports chronic swelling in both of her legs, usually right greater than left, and feels that her swelling is actually better than baseline.  She has had some cramping pain in her legs.  Patient states that she has had some lightheadedness and dizziness and actually lost consciousness while in the ED waiting area.  She reports an episode of nausea and vomiting and some loose stool today as well.  She denies any cough, hemoptysis, hematemesis, hematochezia.  Patient states that she has not been taking any medications for about 3 months now.  She has not been using CPAP.  She denies any tobacco use.  She denies any recent surgeries, travel, or use of estrogen containing products.  She has been mobile and ambulating well prior to her symptom onset.  ED Course  Labs/Imaging on admission: I have personally reviewed following labs and imaging studies.  Initial vitals showed BP 94/59, pulse 82, RR 16, temp 98.8 F, SpO2 90% on 2 L O2 via [place].  While in the ED patient became tachycardic with pulse maintaining between 100-110 and required increased supplemental O2 to 4 L via [address].  Labs show WBC 9.0, hemoglobin 12.4, platelets 208,000, sodium 136, potassium 3.4, bicarb 25, BUN 12, creatinine 0.93, serum glucose 227, BNP 18.8, troponin  5.  CTA chest showed acute bilateral pulmonary artery emboli involving the lobar branches of the upper and lower lobes with CT evidence of right heart strain.  An 8 mm nodule in the left upper lobe versus an area of developing infarct also noted.  Patient was started on IV heparin.  EDP discussed with on-call PCCM who felt patient was stable for medical admission and they will see in consultation.  The hospitalist service was consulted to admit for further evaluation and management.  Review of Systems: All systems reviewed and are negative except as documented in history of present illness above.   Past Medical History:  Diagnosis Date   Anxiety    Depression    Diabetes mellitus without complication (HCC)    type 2   Hypertension    OSA on CPAP     Past Surgical History:  Procedure Laterality Date   ABDOMINAL HYSTERECTOMY     BREAST BIOPSY Left    2010 benign   CESAREAN SECTION  2011   CHOLECYSTECTOMY      Social History:  reports that she has never smoked. She has never used smokeless tobacco. She reports that she does not currently use alcohol. She reports that she does not currently use drugs.  No Known Allergies  Family History  Problem Relation Age of Onset   Breast cancer Maternal Grandmother 45     Prior to Admission medications   Medication Sig Start Date End Date Taking? Authorizing Provider  Apple Cider Vinegar 500 MG TABS Take by mouth.  [provider]  citalopram (CELEXA) 20 MG tablet Take 20 mg by mouth daily.    [provider]  lisinopril (ZESTRIL) 20 MG tablet Take 20 mg by mouth daily.    [provider]  metFORMIN (GLUCOPHAGE) 500 MG tablet Take by mouth 2 (two) times daily with a meal.    [provider]    Physical Exam: Vitals:   12/26/22 1612 12/26/22 1700 12/26/22 1800 12/26/22 1900  BP:  (!) 156/98 (!) 163/101 (!) 172/115  Pulse: (!) 101 (!) 106 (!) 107 (!) 113  Resp: 16 18 18  (!) 21  Temp:       TempSrc:      SpO2: 91% 95% 98% 96%  Weight:      Height:       Constitutional: Resting in bed, NAD, calm, comfortable Eyes: EOMI, lids and conjunctivae normal ENMT: Mucous membranes are moist. Posterior pharynx clear of any exudate or lesions.Normal dentition.  Neck: normal, supple, no masses. Respiratory: clear to auscultation bilaterally, no wheezing, no crackles. Normal respiratory effort while on 4 L O2 via [place]. No accessory muscle use.  Cardiovascular: Tachycardic, no murmurs / rubs / gallops.  +1 bilateral lower extremity edema. 2+ pedal pulses. Abdomen: no tenderness, no masses palpated.  Musculoskeletal: no clubbing / cyanosis. No joint deformity upper and lower extremities. Good ROM, no contractures. Normal muscle tone.  Skin: no rashes, lesions, ulcers. No induration Neurologic: Sensation intact. Strength 5/5 in all 4.  Psychiatric: Normal judgment and insight. Alert and oriented x 3. Normal mood.   EKG: Personally reviewed. Sinus rhythm, rate 87, no acute ischemic changes.  No prior for comparison.  Assessment/Plan Principal Problem:   Bilateral pulmonary embolism (HCC) Active Problems:   Acute respiratory failure with hypoxia (HCC)   Type 2 diabetes mellitus (HCC)   Hypertension associated with diabetes (HCC)   Hypokalemia   OSA (obstructive sleep apnea)   Brittany Giles is a 52 y.o. female with medical history significant for T2DM, HTN, depression/anxiety, OSA on CPAP who is admitted with acute bilateral pulmonary emboli.  Assessment and Plan: Acute hypoxic respiratory failure due to acute bilateral pulmonary emboli: CTA chest shows acute bilateral PE with CT evidence of right heart strain.  She is requiring 4 L supplemental O2 via [place] on admission.  Also has edema of bilateral lower extremities. -Continue IV heparin -Obtain echocardiogram and LE DVT studies -Continue supplemental O2 as needed -PCCM to follow  Type 2 diabetes: Not taking any meds for  about 3 months now.  Started on SSI.  Check A1c.  Hypertension: Has not been taking any meds for about 3 months.  IV hydralazine ordered as needed for SBP QuinZyme 180.  Hypokalemia: Supplementing.  Left upper lobe lung nodule versus developing infarct: CT showed an 8 mm nodule in the left upper lobe versus an area of developing infarct.  Noncontrast chest CT at 6-12 months is recommended.  OSA: Has not been using CPAP at home.   DVT prophylaxis: Heparin drip Code Status: Full code Family Communication: Sister at bedside Disposition Plan: From home, dispo pending clinical progress Consults called: PCCM Severity of Illness: The appropriate patient status for this patient is INPATIENT. Inpatient status is judged to be reasonable and necessary in order to provide the required intensity of service to ensure the patient's safety. The patient's presenting symptoms, physical exam findings, and initial radiographic and laboratory data in the context of their chronic comorbidities is felt to place them at high risk for  further clinical deterioration. Furthermore, it is not anticipated that the patient will be medically stable for discharge from the hospital within 2 midnights of admission.   * I certify that at the point of admission it is my clinical judgment that the patient will require inpatient hospital care spanning beyond 2 midnights from the point of admission due to high intensity of service, high risk for further deterioration and high frequency of surveillance required.Darreld Mclean MD [facility]  If 7PM-7AM, please contact night-coverage [email or web address]  12/26/2022, 7:16 PM

## 2022-12-26 NOTE — Progress Notes (Signed)
PHARMACY - ANTICOAGULATION CONSULT NOTE  Pharmacy Consult for heparin Indication: pulmonary embolus  No Known Allergies  Patient Measurements: Height: 5\' 9"  (175.3 cm) Weight: (!) 137.4 kg (303 lb) IBW/kg (Calculated) : 66.2 Heparin Dosing Weight: 99.2 Kg  Vital Signs: Temp: 98.8 F (37.1 C) (10/13 1559) Temp Source: Oral (10/13 1559) BP: 163/101 (10/13 1800) Pulse Rate: 107 (10/13 1800)  Labs: Recent Labs    12/26/22 1644  HGB 12.4  HCT 39.5  PLT 208  CREATININE 0.93  TROPONINIHS 5    Estimated Creatinine Clearance: 107 mL/min (by C-G formula based on SCr of 0.93 mg/dL).   Medical History: Past Medical History:  Diagnosis Date   Anxiety    Depression    Diabetes mellitus without complication (HCC)    type 2   Hypertension    OSA on CPAP     Medications:  Scheduled:   heparin  3,000 Units Intravenous Once   Assessment: 52 YO female presenting 10/13 with shortness of breath and chest pain for several days. CTA PE positive for submassive PE and right heart strain. Not on anticoagulation prior to admission. Pharmacy consulted for heparin dosing.  Today, 12/26/22 Hgb 12.4, plts 208--stable Scr 0.93--stable No s/sx of bleeding reported  Goal of Therapy:  Heparin level 0.3-0.7 units/ml Monitor platelets by anticoagulation protocol: Yes   Plan:  Give heparin 3000 units IV x1 (per [place] Calculator) Start heparin gtt at 1800 units/hr (per [place] Calculator) Check heparin level in 6 hours Monitor heparin level, CBC, s/sx of bleeding daily   Cherylin Mylar, PharmD Clinical Pharmacist  10/13/20246:45 PM

## 2022-12-26 NOTE — Progress Notes (Signed)
CRITICAL VALUE STICKER  CRITICAL VALUE: Troponin=72  RECEIVER (on-site recipient of call): Laveah Gloster Clydene Pugh, RN  DATE & TIME NOTIFIED: 12/26/2022 at 2302  MESSENGER (representative from lab): Vena Rua  MD NOTIFIED: Sharrell Ku, MD  TIME OF NOTIFICATION: 2303  RESPONSE: No new orders. Monitor for chest pain or respiratory distress.

## 2022-12-26 NOTE — Progress Notes (Signed)
eLink Physician-Brief Progress Note Patient Name: Brittany Giles DOB: 18-Feb-1971 MRN: [id number]   Date of Service  12/26/2022  HPI/Events of Note  52 year female with T2DM, Hypertension., Depression/Anxiety, OSA not using cPAP.   In ICU for  Pre syncope from Hemodynamically stable Submassive PE- right heart strain. BNP normal. Troponin 28. On heparin drip.   Camera;  Resting. No tachycardia, sats good. MAP > 65.  Data reviewed. K 3.4 LA normal CxR neg. Tropo up from 5 to 28.   eICU Interventions  - repeat troponin pending, if jumping high, would consider EKOS  - low thresh hold to go for tPA/EKOS if worsening.  -CTH pending to do for headache., while on heparin drip.  - ECHO - keep K/Mag >4/2 respectively. Already replaced with oral pill from ED.   Discussed with RN.     Intervention Category Major Interventions: Other: Evaluation Type: New Patient Evaluation  Ranee Gosselin 12/26/2022, 10:34 PM  00:22 CT head: not done yet/reported yet Troponin stabilizing at 75 ( from earlier 72-26-5). BNP at 56.  Camera: HR 86, sats 95%, RR is normal. SBP 137.  -follow BNP and tropo in AM, no need for urgent EKOS at this time, since stable hemodynamically, unless gets unstable and worsening markers. Notified CCM team.  - discussed with bed side RN. On nasal o2 at 3 lit , stable, not increasing.no headache any more, after tylenol.

## 2022-12-26 NOTE — Consult Note (Addendum)
NAME:  Brittany Giles, MRN:  [id number], DOB:  28-May-1970, LOS: 0 ADMISSION DATE:  12/26/2022, CONSULTATION DATE:  12/25/22 REFERRING MD:  Dr Darreld Mclean of [facility], CHIEF COMPLAINT:  Submassive PE    History of Present Illness:  52 year female with T2DM, Hypertension., Depression/Anxiety, OSA not using cPAP. HEre for dyspnea x 2 days and chest discomfort and also edema right lower R > L.  Diagnosed with submassive pulmonary embolism with RV LV ratio greater than 1.2.  In the emergency department patient did have a syncopal /presyncopal episode.  Denies any fever chills or cough.  There are some right sided somewhat pleuritic pain.  Admitted by the hospitalist started on IV heparin.  Biomarkers are essentially normal [see below].  Moved to the ICU stepdown unit.  Saturating 94% on 2 L nasal cannula with normal heart rate.     Latest Reference Range & Units 12/26/22 16:44 12/26/22 16:45 12/26/22 18:12  B Natriuretic Peptide 0.0 - 100.0 pg/mL  18.8   Troponin I (High Sensitivity) <18 ng/L 5  26 (H)    Past Medical History:    has a past medical history of Anxiety, Depression, Diabetes mellitus without complication (HCC), Hypertension, and OSA on CPAP.   reports that she has never smoked. She has never used smokeless tobacco.  Past Surgical History:  Procedure Laterality Date   ABDOMINAL HYSTERECTOMY     BREAST BIOPSY Left    2010 benign   CESAREAN SECTION  2011   CHOLECYSTECTOMY      No Known Allergies   There is no immunization history on file for this patient.  Family History  Problem Relation Age of Onset   Breast cancer Maternal Grandmother 67     Current Facility-Administered Medications:    acetaminophen (TYLENOL) tablet 1,000 mg, 1,000 mg, Oral, Q6H PRN **OR** acetaminophen (TYLENOL) suppository 650 mg, 650 mg, Rectal, Q6H PRN, Allena Katz, Vishal R, MD   heparin ADULT infusion 100 units/mL (25000 units/277mL), 1,800 Units/hr, Intravenous, Continuous, Cherylin Mylar, [facility]osp [facility] , Last Rate: 18 mL/hr at 12/26/22 1914, 1,800 Units/hr at 12/26/22 1914   hydrALAZINE (APRESOLINE) injection 10 mg, 10 mg, Intravenous, Q4H PRN, Allena Katz, Vishal R, MD   insulin aspart (novoLOG) injection 0-5 Units, 0-5 Units, Subcutaneous, QHS, Patel, Vishal R, MD   [START ON 12/27/2022] insulin aspart (novoLOG) injection 0-9 Units, 0-9 Units, Subcutaneous, TID WC, Patel, Vishal R, MD   ondansetron (ZOFRAN) tablet 4 mg, 4 mg, Oral, Q6H PRN **OR** ondansetron (ZOFRAN) injection 4 mg, 4 mg, Intravenous, Q6H PRN, Allena Katz, Vishal R, MD   oxyCODONE (Oxy IR/ROXICODONE) immediate release tablet 5 mg, 5 mg, Oral, Q4H PRN, Patel, Vishal R, MD   senna-docusate (Senokot-S) tablet 1 tablet, 1 tablet, Oral, QHS PRN, Patel, Vishal R, MD   sodium chloride flush (NS) 0.9 % injection 3 mL, 3 mL, Intravenous, Q12H, Patel, Vishal R, MD  Current Outpatient Medications:    Apple Cider Vinegar 500 MG TABS, Take by mouth., Disp: , Rfl:    citalopram (CELEXA) 20 MG tablet, Take 20 mg by mouth daily., Disp: , Rfl:    lisinopril (ZESTRIL) 20 MG tablet, Take 20 mg by mouth daily., Disp: , Rfl:    metFORMIN (GLUCOPHAGE) 500 MG tablet, Take by mouth 2 (two) times daily with a meal., Disp: , Rfl:      Significant Hospital Events:  12/26/2022 - admit   Interim History / Subjective:   12/26/2022 - seen in bed 1239  Objective   Blood pressure (!) 172/115,  pulse (!) 113, temperature 99.2 F (37.3 C), temperature source Oral, resp. rate (!) 21, height 5\' 9"  (1.753 m), weight (!) 137.4 kg, SpO2 96%.       No intake or output data in the 24 hours ending 12/26/22 2042 Filed Weights   12/26/22 1559  Weight: (!) 137.4 kg    Examination: General: Looks well no distress HENT: 2 L nasal cannula pulse ox 94% Lungs: No distress clear to auscultation bilaterally Cardiovascular: Normal heart rate Abdomen: Soft nontender no organomegaly Extremities: No cyanosis,  no clubbing no edema Neuro: Alert and oriented x  3, speech normal moves all 4 extremities GU: Not examined  Resolved Hospital Problem list   x  Assessment & Plan:    Acute hypoxemic respiratory failure - 2L [place] due to  submassive PE RV/LV ratio >= 1.2  - Presyncope in ER prior to admit 12/26/2022  - Normal PE bioarmkerts  8mm LUL nodule/infarct    P:   IV heparin gtt ok -> but low threshold for rescue with systemic Lyiss (tPAH systemic OR EKOS( ECHO urgent - > to help determine need for lyssis Track PE biomarkets - recheck tonight and 12/27/22 AM [place] o2 for pusle ox > 94% (higher pulse ox du eto racial pulse ox gap)  Strict bed rest given recent admit presyncope   Headache at admit in ER  - 12/26/2022 resolved in ICU. Neuro intact but later RN said headache + though half severity  P:   Monitir Get CT head due to anticoagulation     Best practice (daily eval):  Per [facility]   Goals of Care:     ATTESTATION & SIGNATURE   The patient Brittany Giles is critically ill with multiple organ systems failure and requires high complexity decision making for assessment and support, frequent evaluation and titration of therapies, application of advanced monitoring technologies and extensive interpretation of multiple databases.   Critical Care Time devoted to patient care services described in this note is  40  Minutes. This time reflects time of care of this signee Dr Kalman Shan. This critical care time does not reflect procedure time, or teaching time or supervisory time of PA/NP/Med student/Med Resident etc but could involve care discussion time      SIGNATURE    Dr. Kalman Shan, M.D., F.C.C.P,  Pulmonary and Critical Care Medicine Staff Physician, [facility] Director - Interstitial Lung Disease  Program  Pulmonary Fibrosis [facility] [place], [place], [address]  NPI Number:  NPI #[id number]  Pager: [phone], If no answer  -> Check  AMION or Try [phone] Telephone (clinical office): [phone] Telephone (research): [phone]  8:42 PM 12/26/2022   12/26/2022 8:42 PM    LABS    PULMONARY No results for input(s): "PHART", "PCO2ART", "PO2ART", "HCO3", "TCO2", "O2SAT" in the last 168 hours.  Invalid input(s): "PCO2", "PO2"  CBC Recent Labs  Lab 12/26/22 1644  HGB 12.4  HCT 39.5  WBC 9.0  PLT 208    COAGULATION Recent Labs  Lab 12/26/22 1846  INR 1.1    CARDIAC  No results for input(s): "TROPONINI" in the last 168 hours. No results for input(s): "PROBNP" in the last 168 hours.  CHEMISTRY Recent Labs  Lab 12/26/22 1644  NA 136  K 3.4*  CL 100  CO2 25  GLUCOSE 227*  BUN 12  CREATININE 0.93  CALCIUM 9.5   Estimated Creatinine Clearance: 107  mL/min (by C-G formula based on SCr of 0.93 mg/dL).   LIVER Recent Labs  Lab 12/26/22 1846  INR 1.1     INFECTIOUS No results for input(s): "LATICACIDVEN", "PROCALCITON" in the last 168 hours.   ENDOCRINE CBG (last 3)  Recent Labs    12/26/22 1557  GLUCAP 178*         IMAGING x48h  - image(s) personally visualized  -   highlighted in bold CT Angio Chest PE W and/or Wo Contrast  Result Date: 12/26/2022 CLINICAL DATA:  Shortness of breath and chest pain. Hyperglycemia. Concern for pulmonary embolism. EXAM: CT ANGIOGRAPHY CHEST WITH CONTRAST TECHNIQUE: Multidetector CT imaging of the chest was performed using the standard protocol during bolus administration of intravenous contrast. Multiplanar CT image reconstructions and MIPs were obtained to evaluate the vascular anatomy. RADIATION DOSE REDUCTION: This exam was performed according to the departmental dose-optimization program which includes automated exposure control, adjustment of the mA and/or kV according to patient size and/or use of iterative reconstruction technique. CONTRAST:  75mL OMNIPAQUE IOHEXOL 350 MG/ML SOLN COMPARISON:  Chest radiograph dated 12/26/2022.  FINDINGS: Evaluation is limited due to streak artifact caused by patient's arms. Cardiovascular: Top-normal cardiac size. No pericardial effusion. The thoracic aorta is unremarkable. Bilateral pulmonary artery emboli involving the lobar branches of the upper and lower lobes. There is right heart straining with RV/LV ratio in the range of 1.1-1.3. Mediastinum/Nodes: No hilar or mediastinal adenopathy. The esophagus is grossly unremarkable. No mediastinal fluid collection. Lungs/Pleura: There is an 8 mm nodule in the left upper lobe versus an area of developing infarct. No consolidative changes. There is no pleural effusion or pneumothorax. The central airways are patent. Upper Abdomen: Fatty liver.  Cholecystectomy. Musculoskeletal: Degenerative changes of the spine. No acute osseous pathology. Review of the MIP images confirms the above findings. IMPRESSION: 1. Positive for acute PE with CT evidence of right heart strain (RV/LV Ratio = 1.2) consistent with at least submassive (intermediate risk) PE. The presence of right heart strain has been associated with an increased risk of morbidity and mortality. Please refer to the "Code PE Focused" order set in EPIC. 2. An 8 mm nodule in the left upper lobe versus an area of developing infarct. Non-contrast chest CT at 6-12 months is recommended. If the nodule is stable at time of repeat CT, then future CT at 18-24 months (from today's scan) is considered optional for low-risk patients, but is recommended for high-risk patients. This recommendation follows the consensus statement: Guidelines for Management of Incidental Pulmonary Nodules Detected on CT Images: From the Fleischner Society 2017; Radiology 2017; 284:228-243. 3. Fatty liver. These results were called by telephone at the time of interpretation on 12/26/2022 at 6:10 pm to provider [facility] ROEMHILDT , who verbally acknowledged these results. Electronically Signed   By: Elgie Collard M.D.   On: 12/26/2022 18:13    DG Chest 2 View  Result Date: 12/26/2022 CLINICAL DATA:  Chest pain, shortness of breath EXAM: CHEST - 2 VIEW COMPARISON:  None Available. FINDINGS: The heart size and mediastinal contours are within normal limits. Both lungs are clear. The visualized skeletal structures are unremarkable. IMPRESSION: No acute abnormality of the lungs. Electronically Signed   By: Jearld Lesch M.D.   On: 12/26/2022 17:17

## 2022-12-26 NOTE — ED Triage Notes (Signed)
Pt here with shob and chest pain for the last several days, worsening today. Pt also reports blood sugar in the 300's yesterday, has not been compliant with medications.

## 2022-12-26 NOTE — Hospital Course (Signed)
Brittany Giles is a 52 y.o. female with medical history significant for T2DM, HTN, depression/anxiety, OSA on CPAP who is admitted with acute bilateral pulmonary emboli.

## 2022-12-26 NOTE — ED Notes (Signed)
ED TO INPATIENT HANDOFF REPORT  Name/Age/Gender Brittany Giles 52 y.o. female  Code Status   Home/SNF/Other Home  Chief Complaint Bilateral pulmonary embolism (HCC) [I26.99]  Level of Care/Admitting Diagnosis ED Disposition     ED Disposition  Admit   Condition  --   Comment  Hospital Area: [facility] [[id number]]  Level of Care: Stepdown [14]  Admit to SDU based on following criteria: Respiratory Distress:  Frequent assessment and/or intervention to maintain adequate ventilation/respiration, pulmonary toilet, and respiratory treatment.  May admit patient to [facility] or [facility] if equivalent level of care is available:: No  Covid Evaluation: Asymptomatic - no recent exposure (last 10 days) testing not required  Diagnosis: Bilateral pulmonary embolism [facility]) [[id number]]  Admitting Physician: Charlsie Quest [[id number]]  Attending Physician: Charlsie Quest [[id number]]  Certification:: I certify this patient will need inpatient services for at least 2 midnights  Expected Medical Readiness: 12/29/2022          Medical History Past Medical History:  Diagnosis Date   Anxiety    Depression    Diabetes mellitus without complication (HCC)    type 2   Hypertension    OSA on CPAP     Allergies No Known Allergies  IV Location/Drains/Wounds Patient Lines/Drains/Airways Status     Active Line/Drains/Airways     Name Placement date Placement time Site Days   Peripheral IV 12/26/22 20 G Right Antecubital 12/26/22  1645  Antecubital  less than 1            Labs/Imaging Results for orders placed or performed during the hospital encounter of 12/26/22 (from the past 48 hour(s))  CBG monitoring, ED     Status: Abnormal   Collection Time: 12/26/22  3:57 PM  Result Value Ref Range   Glucose-Capillary 178 (H) 70 - 99 mg/dL    Comment: Glucose reference range applies only to samples taken after fasting for at least 8 hours.  Basic metabolic  panel     Status: Abnormal   Collection Time: 12/26/22  4:44 PM  Result Value Ref Range   Sodium 136 135 - 145 mmol/L   Potassium 3.4 (L) 3.5 - 5.1 mmol/L   Chloride 100 98 - 111 mmol/L   CO2 25 22 - 32 mmol/L   Glucose, Bld 227 (H) 70 - 99 mg/dL    Comment: Glucose reference range applies only to samples taken after fasting for at least 8 hours.   BUN 12 6 - 20 mg/dL   Creatinine, Ser 8.41 0.44 - 1.00 mg/dL   Calcium 9.5 8.9 - 66.0 mg/dL   GFR, Estimated >63 >01 mL/min    Comment: (NOTE) Calculated using the CKD-EPI Creatinine Equation (2021)    Anion gap 11 5 - 15    Comment: Performed at [facility], [address], [place], [place] [address]  CBC     Status: None   Collection Time: 12/26/22  4:44 PM  Result Value Ref Range   WBC 9.0 4.0 - 10.5 K/uL   RBC 4.42 3.87 - 5.11 MIL/uL   Hemoglobin 12.4 12.0 - 15.0 g/dL   HCT 32.3 55.7 - 32.2 %   MCV 89.4 80.0 - 100.0 fL   MCH 28.1 26.0 - 34.0 pg   MCHC 31.4 30.0 - 36.0 g/dL   RDW 02.5 42.7 - 06.2 %   Platelets 208 150 - 400 K/uL   nRBC 0.0 0.0 - 0.2 %    Comment: Performed  at [facility], [address], [place], [place] [address]  Troponin I (High Sensitivity)     Status: None   Collection Time: 12/26/22  4:44 PM  Result Value Ref Range   Troponin I (High Sensitivity) 5 <18 ng/L    Comment: (NOTE) Elevated high sensitivity troponin I (hsTnI) values and significant  changes across serial measurements may suggest ACS but many other  chronic and acute conditions are known to elevate hsTnI results.  Refer to the "Links" section for chest pain algorithms and additional  guidance. Performed at [facility], [address], [place], [place] [address]   Brain natriuretic peptide     Status: None   Collection Time: 12/26/22  4:45 PM  Result Value Ref Range   B Natriuretic Peptide 18.8 0.0 - 100.0 pg/mL    Comment: Performed at [facility], [address], [place], [place] [address]   CT Angio Chest PE W and/or Wo Contrast  Result Date: 12/26/2022 CLINICAL DATA:  Shortness of breath and chest pain. Hyperglycemia. Concern for pulmonary embolism. EXAM: CT ANGIOGRAPHY CHEST WITH CONTRAST TECHNIQUE: Multidetector CT imaging of the chest was performed using the standard protocol during bolus administration of intravenous contrast. Multiplanar CT image reconstructions and MIPs were obtained to evaluate the vascular anatomy. RADIATION DOSE REDUCTION: This exam was performed according to the departmental dose-optimization program which includes automated exposure control, adjustment of the mA and/or kV according to patient size and/or use of iterative reconstruction technique. CONTRAST:  75mL OMNIPAQUE IOHEXOL 350 MG/ML SOLN COMPARISON:  Chest radiograph dated 12/26/2022. FINDINGS: Evaluation is limited due to streak artifact caused by patient's arms. Cardiovascular: Top-normal cardiac size. No pericardial effusion. The thoracic aorta is unremarkable. Bilateral pulmonary artery emboli involving the lobar branches of the upper and lower lobes. There is right heart straining with RV/LV ratio in the range of 1.1-1.3. Mediastinum/Nodes: No hilar or mediastinal adenopathy. The esophagus is grossly unremarkable. No mediastinal fluid collection. Lungs/Pleura: There is an 8 mm nodule in the left upper lobe versus an area of developing infarct. No consolidative changes. There is no pleural effusion or pneumothorax. The central airways are patent. Upper Abdomen: Fatty liver.  Cholecystectomy. Musculoskeletal: Degenerative changes of the spine. No acute osseous pathology. Review of the MIP images confirms the above findings. IMPRESSION: 1. Positive for acute PE with CT evidence of right heart strain (RV/LV Ratio = 1.2) consistent with at least submassive (intermediate risk) PE. The presence of right heart strain has been associated with an increased risk of morbidity and  mortality. Please refer to the "Code PE Focused" order set in EPIC. 2. An 8 mm nodule in the left upper lobe versus an area of developing infarct. Non-contrast chest CT at 6-12 months is recommended. If the nodule is stable at time of repeat CT, then future CT at 18-24 months (from today's scan) is considered optional for low-risk patients, but is recommended for high-risk patients. This recommendation follows the consensus statement: Guidelines for Management of Incidental Pulmonary Nodules Detected on CT Images: From the Fleischner Society 2017; Radiology 2017; 284:228-243. 3. Fatty liver. These results were called by telephone at the time of interpretation on 12/26/2022 at 6:10 pm to provider [facility] ROEMHILDT , who verbally acknowledged these results. Electronically Signed   By: Elgie Collard M.D.   On: 12/26/2022 18:13   DG Chest 2 View  Result Date: 12/26/2022 CLINICAL DATA:  Chest pain, shortness of breath EXAM: CHEST - 2 VIEW COMPARISON:  None Available. FINDINGS: The heart size and mediastinal contours are within normal limits. Both lungs are clear. The visualized skeletal structures are unremarkable. IMPRESSION: No acute abnormality of the lungs. Electronically Signed   By: Jearld Lesch M.D.   On: 12/26/2022 17:17    Pending Labs Unresulted Labs (From admission, onward)     Start     Ordered   12/27/22 0500  CBC  Daily,   R      12/26/22 1848   12/27/22 0200  Heparin level (unfractionated)  Once-Timed,   TIMED        12/26/22 1848   12/26/22 1818  Protime-INR  Once,   STAT        12/26/22 1819   12/26/22 1818  APTT  Once,   STAT        12/26/22 1819            Vitals/Pain Today's Vitals   12/26/22 1610 12/26/22 1612 12/26/22 1700 12/26/22 1800  BP: (!) 155/91  (!) 156/98 (!) 163/101  Pulse: 100 (!) 101 (!) 106 (!) 107  Resp:  16 18 18   Temp:      TempSrc:      SpO2: (!) 87% 91% 95% 98%  Weight:      Height:      PainSc:        Isolation Precautions No active  isolations  Medications Medications  heparin bolus via infusion 3,000 Units (has no administration in time range)  heparin ADULT infusion 100 units/mL (25000 units/23mL) (has no administration in time range)  iohexol (OMNIPAQUE) 350 MG/ML injection 75 mL (75 mLs Intravenous Contrast Given 12/26/22 1745)  acetaminophen (TYLENOL) tablet 1,000 mg (1,000 mg Oral Given 12/26/22 1837)    Mobility walks

## 2022-12-26 NOTE — ED Notes (Signed)
Pt placed on n/c 4lpm

## 2022-12-26 NOTE — ED Provider Notes (Signed)
[place] [facility] AT [facility] Provider Note   CSN: [id number] Arrival date & time: 12/26/22  1543     History  Chief Complaint  Patient presents with   Shortness of Breath   Chest Pain    Brittany Giles is a 52 y.o. female with history of OSA on CPAP, T2DM with reported non compliance, HTN, anxiety, depression, who presents to the ER complaining of SOB and CP for past few days, worsening today.  Patient states she has never had anything like this before.  She does not wear home oxygen.  She did have a syncopal episode in the waiting room prior to being brought to the back of the department.  She denies any worsening of pain just prior to syncopal episode, states she just felt dizzy. She denies any fever, chills, cough.  Localizes her pain to the right side of her chest, with no aggravating or alleviating factors.  She was having some right sided abdominal pain as well, but states this was relieved after she had a bowel movement.  It was a normal bowel movement for her.  She did have an episode of vomiting earlier today, no nausea at this time.  Right now she is just complaining of a headache.  She has noticed some more bilateral leg swelling in the past few days, left greater than right.  She has been wearing compression socks to help with this.  Reports blood sugar has been running in the 300's the past few days, reports noncompliance with medications.    Shortness of Breath Associated symptoms: chest pain and headaches   Chest Pain Associated symptoms: dizziness, headache and shortness of breath        Home Medications Prior to Admission medications   Medication Sig Start Date End Date Taking? Authorizing Provider  Apple Cider Vinegar 500 MG TABS Take by mouth.    [provider]  citalopram (CELEXA) 20 MG tablet Take 20 mg by mouth daily.    [provider]  lisinopril (ZESTRIL) 20 MG tablet Take 20 mg by mouth daily.     [provider]  metFORMIN (GLUCOPHAGE) 500 MG tablet Take by mouth 2 (two) times daily with a meal.    [provider]      Allergies    Patient has no known allergies.    Review of Systems   Review of Systems  Respiratory:  Positive for shortness of breath.   Cardiovascular:  Positive for chest pain and leg swelling.  Neurological:  Positive for dizziness, syncope and headaches.  All other systems reviewed and are negative.   Physical Exam Updated Vital Signs BP (!) 163/101   Pulse (!) 107   Temp 98.8 F (37.1 C) (Oral)   Resp 18   Ht 5\' 9"  (1.753 m)   Wt (!) 137.4 kg   SpO2 98%   BMI 44.75 kg/m  Physical Exam Vitals and nursing note reviewed.  Constitutional:      Appearance: Normal appearance.  HENT:     Head: Normocephalic and atraumatic.  Eyes:     Conjunctiva/sclera: Conjunctivae normal.  Cardiovascular:     Rate and Rhythm: Normal rate and regular rhythm.  Pulmonary:     Effort: Pulmonary effort is normal. No respiratory distress.     Breath sounds: Normal breath sounds.     Comments: On 4 L [place] Abdominal:     General: There is no distension.     Palpations: Abdomen is soft.  Tenderness: There is no abdominal tenderness.  Musculoskeletal:     Right lower leg: 1+ Pitting Edema present.     Left lower leg: 1+ Pitting Edema present.  Skin:    General: Skin is warm and dry.  Neurological:     General: No focal deficit present.     Mental Status: She is alert.     ED Results / Procedures / Treatments   Labs (all labs ordered are listed, but only abnormal results are displayed) Labs Reviewed  BASIC METABOLIC PANEL - Abnormal; Notable for the following components:      Result Value   Potassium 3.4 (*)    Glucose, Bld 227 (*)    All other components within normal limits  CBG MONITORING, ED - Abnormal; Notable for the following components:   Glucose-Capillary 178 (*)    All other components within normal limits  CBC  BRAIN  NATRIURETIC PEPTIDE  PROTIME-INR  APTT  HEPARIN LEVEL (UNFRACTIONATED)  CBC  TROPONIN I (HIGH SENSITIVITY)  TROPONIN I (HIGH SENSITIVITY)    EKG EKG Interpretation Date/Time:  Sunday December 26 2022 16:01:22 EDT Ventricular Rate:  87 PR Interval:  181 QRS Duration:  87 QT Interval:  383 QTC Calculation: 461 R Axis:   4  Text Interpretation: Sinus rhythm Confirmed by Vanetta Mulders [phone]) on 12/26/2022 6:16:11 PM  Radiology CT Angio Chest PE W and/or Wo Contrast  Result Date: 12/26/2022 CLINICAL DATA:  Shortness of breath and chest pain. Hyperglycemia. Concern for pulmonary embolism. EXAM: CT ANGIOGRAPHY CHEST WITH CONTRAST TECHNIQUE: Multidetector CT imaging of the chest was performed using the standard protocol during bolus administration of intravenous contrast. Multiplanar CT image reconstructions and MIPs were obtained to evaluate the vascular anatomy. RADIATION DOSE REDUCTION: This exam was performed according to the departmental dose-optimization program which includes automated exposure control, adjustment of the mA and/or kV according to patient size and/or use of iterative reconstruction technique. CONTRAST:  75mL OMNIPAQUE IOHEXOL 350 MG/ML SOLN COMPARISON:  Chest radiograph dated 12/26/2022. FINDINGS: Evaluation is limited due to streak artifact caused by patient's arms. Cardiovascular: Top-normal cardiac size. No pericardial effusion. The thoracic aorta is unremarkable. Bilateral pulmonary artery emboli involving the lobar branches of the upper and lower lobes. There is right heart straining with RV/LV ratio in the range of 1.1-1.3. Mediastinum/Nodes: No hilar or mediastinal adenopathy. The esophagus is grossly unremarkable. No mediastinal fluid collection. Lungs/Pleura: There is an 8 mm nodule in the left upper lobe versus an area of developing infarct. No consolidative changes. There is no pleural effusion or pneumothorax. The central airways are patent. Upper Abdomen: Fatty  liver.  Cholecystectomy. Musculoskeletal: Degenerative changes of the spine. No acute osseous pathology. Review of the MIP images confirms the above findings. IMPRESSION: 1. Positive for acute PE with CT evidence of right heart strain (RV/LV Ratio = 1.2) consistent with at least submassive (intermediate risk) PE. The presence of right heart strain has been associated with an increased risk of morbidity and mortality. Please refer to the "Code PE Focused" order set in EPIC. 2. An 8 mm nodule in the left upper lobe versus an area of developing infarct. Non-contrast chest CT at 6-12 months is recommended. If the nodule is stable at time of repeat CT, then future CT at 18-24 months (from today's scan) is considered optional for low-risk patients, but is recommended for high-risk patients. This recommendation follows the consensus statement: Guidelines for Management of Incidental Pulmonary Nodules Detected on CT Images: From the Fleischner Society 2017; Radiology  2017; 409:811-914. 3. Fatty liver. These results were called by telephone at the time of interpretation on 12/26/2022 at 6:10 pm to provider [facility] Baleria Wyman , who verbally acknowledged these results. Electronically Signed   By: Elgie Collard M.D.   On: 12/26/2022 18:13   DG Chest 2 View  Result Date: 12/26/2022 CLINICAL DATA:  Chest pain, shortness of breath EXAM: CHEST - 2 VIEW COMPARISON:  None Available. FINDINGS: The heart size and mediastinal contours are within normal limits. Both lungs are clear. The visualized skeletal structures are unremarkable. IMPRESSION: No acute abnormality of the lungs. Electronically Signed   By: Jearld Lesch M.D.   On: 12/26/2022 17:17    Procedures .Critical Care  Performed by: Su Monks, PA-C Authorized by: Su Monks, PA-C   Critical care provider statement:    Critical care time (minutes):  30   Critical care was necessary to treat or prevent imminent or life-threatening deterioration of  the following conditions:  Respiratory failure   Critical care was time spent personally by me on the following activities:  Development of treatment plan with patient or surrogate, discussions with consultants, evaluation of patient's response to treatment, examination of patient, ordering and review of laboratory studies, ordering and review of radiographic studies, ordering and performing treatments and interventions, pulse oximetry, re-evaluation of patient's condition and review of old charts   Care discussed with: admitting provider   Comments:     Bilateral PE requiring heparin and new oxygen requirement     Medications Ordered in ED Medications  heparin bolus via infusion 3,000 Units (has no administration in time range)  heparin ADULT infusion 100 units/mL (25000 units/222mL) (has no administration in time range)  iohexol (OMNIPAQUE) 350 MG/ML injection 75 mL (75 mLs Intravenous Contrast Given 12/26/22 1745)  acetaminophen (TYLENOL) tablet 1,000 mg (1,000 mg Oral Given 12/26/22 1837)    ED Course/ Medical Decision Making/ A&P                                 Medical Decision Making Amount and/or Complexity of Data Reviewed Labs: ordered. Radiology: ordered.   This patient is a 52 y.o. female  who presents to the ED for concern of chest pain, SOB, leg swelling x several days. Syncopal episode in ER waiting room.    Differential diagnoses prior to evaluation: The emergent differential diagnosis includes, but is not limited to,  ACS, pericarditis, myocarditis, aortic dissection, PE, pneumothorax, esophageal spasm or rupture, chronic angina, pneumonia, bronchitis, GERD, reflux/PUD, biliary disease, pancreatitis, costochondritis, anxiety. This is not an exhaustive differential.   Past Medical History / Co-morbidities / Social History: OSA on CPAP, T2DM with reported non compliance, HTN, anxiety, depression  Additional history: Chart reviewed. Pertinent results include: No hx of  CHF or prior echocardiograms in system  Physical Exam: Physical exam performed. The pertinent findings include: Tachycardic, hypoxic, requiring 4L [place]. Bilateral leg swelling, L>R.  Lab Tests/Imaging studies: I personally interpreted labs/imaging and the pertinent results include:  CBC normal. Elevated glucose to 221 but no anion gap. . Initial troponin 5, delta troponin pending at time of admission.   CXR with no acute abnormalities. CT PE study shows bilateral pulmonary artery emboli involving branches of upper and lower lobes with CT evidence of right heart strain. I agree with the radiologist interpretation.  Cardiac monitoring: EKG obtained and interpreted by myself and attending physician which shows: NSR   Medications: I ordered  medication including tylenol for headache, heparin for PE.  I have reviewed the patients home medicines and have made adjustments as needed.  Consultations obtained: I consulted with critical care Dr Delton Coombes who will consult on patient once admitted. He plans to wait for echocardiogram prior to considering IR consultation for possible thrombolysis.   I consulted with hospitalist Dr Allena Katz who will admit.    Disposition: After consideration of the diagnostic results and the patients response to treatment, I feel that patient is requiring admission for management of PE with CT evidence of right heart strain.   Final Clinical Impression(s) / ED Diagnoses Final diagnoses:  Bilateral pulmonary embolism (HCC)  Acute respiratory failure with hypoxia (HCC)    Rx / DC Orders ED Discharge Orders     None      Portions of this report may have been transcribed using voice recognition software. Every effort was made to ensure accuracy; however, inadvertent computerized transcription errors may be present.    Jeanella Flattery 12/26/22 1849    Vanetta Mulders, MD 12/26/22 [phone]

## 2022-12-27 ENCOUNTER — Telehealth: Payer: Self-pay | Admitting: Internal Medicine

## 2022-12-27 ENCOUNTER — Other Ambulatory Visit (HOSPITAL_COMMUNITY): Payer: Self-pay

## 2022-12-27 ENCOUNTER — Inpatient Hospital Stay (HOSPITAL_COMMUNITY): Payer: BC Managed Care – PPO

## 2022-12-27 DIAGNOSIS — E1159 Type 2 diabetes mellitus with other circulatory complications: Secondary | ICD-10-CM | POA: Diagnosis not present

## 2022-12-27 DIAGNOSIS — I152 Hypertension secondary to endocrine disorders: Secondary | ICD-10-CM

## 2022-12-27 DIAGNOSIS — E876 Hypokalemia: Secondary | ICD-10-CM

## 2022-12-27 DIAGNOSIS — J9601 Acute respiratory failure with hypoxia: Secondary | ICD-10-CM

## 2022-12-27 DIAGNOSIS — E119 Type 2 diabetes mellitus without complications: Secondary | ICD-10-CM

## 2022-12-27 DIAGNOSIS — Z86711 Personal history of pulmonary embolism: Secondary | ICD-10-CM

## 2022-12-27 DIAGNOSIS — G4733 Obstructive sleep apnea (adult) (pediatric): Secondary | ICD-10-CM

## 2022-12-27 DIAGNOSIS — I2699 Other pulmonary embolism without acute cor pulmonale: Secondary | ICD-10-CM | POA: Diagnosis not present

## 2022-12-27 LAB — BASIC METABOLIC PANEL
Anion gap: 8 (ref 5–15)
BUN: 12 mg/dL (ref 6–20)
CO2: 24 mmol/L (ref 22–32)
Calcium: 9.3 mg/dL (ref 8.9–10.3)
Chloride: 103 mmol/L (ref 98–111)
Creatinine, Ser: 0.86 mg/dL (ref 0.44–1.00)
GFR, Estimated: >60 mL/min (ref >60)
Glucose, Bld: 178 mg/dL — ABNORMAL HIGH (ref 70–99)
Potassium: 3.8 mmol/L (ref 3.5–5.1)
Sodium: 135 mmol/L (ref 135–145)

## 2022-12-27 LAB — CBC
HCT: 39.4 % (ref 36.0–46.0)
HCT: 40.4 % (ref 36.0–46.0)
Hemoglobin: 12.5 g/dL (ref 12.0–15.0)
Hemoglobin: 12.7 g/dL (ref 12.0–15.0)
MCH: 27.8 pg (ref 26.0–34.0)
MCH: 28 pg (ref 26.0–34.0)
MCHC: 31.4 g/dL (ref 30.0–36.0)
MCHC: 31.7 g/dL (ref 30.0–36.0)
MCV: 88.1 fL (ref 80.0–100.0)
MCV: 88.4 fL (ref 80.0–100.0)
Platelets: 232 10*3/uL (ref 150–400)
Platelets: 244 10*3/uL (ref 150–400)
RBC: 4.47 MIL/uL (ref 3.87–5.11)
RBC: 4.57 MIL/uL (ref 3.87–5.11)
RDW: 15.3 % (ref 11.5–15.5)
RDW: 15.4 % (ref 11.5–15.5)
WBC: 5.1 10*3/uL (ref 4.0–10.5)
WBC: 7.9 10*3/uL (ref 4.0–10.5)
nRBC: 0 % (ref 0.0–0.2)
nRBC: 0 % (ref 0.0–0.2)

## 2022-12-27 LAB — TROPONIN I (HIGH SENSITIVITY)
Troponin I (High Sensitivity): 39 ng/L — ABNORMAL HIGH (ref <18)
Troponin I (High Sensitivity): 75 ng/L — ABNORMAL HIGH (ref <18)

## 2022-12-27 LAB — HEMOGLOBIN A1C
Hgb A1c MFr Bld: 10.5 % — ABNORMAL HIGH (ref 4.8–5.6)
Mean Plasma Glucose: 254.65 mg/dL

## 2022-12-27 LAB — HEPARIN LEVEL (UNFRACTIONATED)
Heparin Unfractionated: 0.8 [IU]/mL — ABNORMAL HIGH (ref 0.30–0.70)
Heparin Unfractionated: 0.62 [IU]/mL (ref 0.30–0.70)
Heparin Unfractionated: 0.71 [IU]/mL — ABNORMAL HIGH (ref 0.30–0.70)

## 2022-12-27 LAB — GLUCOSE, CAPILLARY
Glucose-Capillary: 205 mg/dL — ABNORMAL HIGH (ref 70–99)
Glucose-Capillary: 143 mg/dL — ABNORMAL HIGH (ref 70–99)
Glucose-Capillary: 145 mg/dL — ABNORMAL HIGH (ref 70–99)
Glucose-Capillary: 181 mg/dL — ABNORMAL HIGH (ref 70–99)

## 2022-12-27 LAB — PROTIME-INR
INR: 1.1 (ref 0.8–1.2)
Prothrombin Time: 14 s (ref 11.4–15.2)

## 2022-12-27 LAB — BRAIN NATRIURETIC PEPTIDE: B Natriuretic Peptide: 56.8 pg/mL (ref 0.0–100.0)

## 2022-12-27 LAB — PHOSPHORUS: Phosphorus: 3.7 mg/dL (ref 2.5–4.6)

## 2022-12-27 LAB — D-DIMER, QUANTITATIVE: D-Dimer, Quant: 8.36 ug{FEU}/mL — ABNORMAL HIGH (ref 0.00–0.50)

## 2022-12-27 LAB — HIV ANTIBODY (ROUTINE TESTING W REFLEX): HIV Screen 4th Generation wRfx: NONREACTIVE

## 2022-12-27 LAB — MAGNESIUM: Magnesium: 1.8 mg/dL (ref 1.7–2.4)

## 2022-12-27 LAB — LACTIC ACID, PLASMA: Lactic Acid, Venous: 1.1 mmol/L (ref 0.5–1.9)

## 2022-12-27 MED ORDER — SALINE SPRAY 0.65 % NA SOLN
1.0000 | NASAL | Status: DC | PRN
  Filled 2022-12-27: qty 44

## 2022-12-27 MED ORDER — HEPARIN (PORCINE) 25000 UT/250ML-% IV SOLN
1400.0000 [IU]/h | INTRAVENOUS | Status: DC
  Administered 2022-12-28: 1400 [IU]/h via INTRAVENOUS
  Filled 2022-12-27: qty 250

## 2022-12-27 MED ORDER — INSULIN GLARGINE-YFGN 100 UNIT/ML ~~LOC~~ SOLN
20.0000 [IU] | Freq: Every day | SUBCUTANEOUS | Status: DC
  Administered 2022-12-27 – 2022-12-29 (×3): 20 [IU] via SUBCUTANEOUS
  Filled 2022-12-27 (×3): qty 0.2

## 2022-12-27 MED ORDER — CITALOPRAM HYDROBROMIDE 20 MG PO TABS
20.0000 mg | ORAL_TABLET | Freq: Every day | ORAL | Status: DC
  Administered 2022-12-27 – 2022-12-29 (×3): 20 mg via ORAL
  Filled 2022-12-27 (×3): qty 1

## 2022-12-27 NOTE — Progress Notes (Signed)
PHARMACY - ANTICOAGULATION CONSULT NOTE  Pharmacy Consult for heparin Indication: pulmonary embolus  No Known Allergies  Patient Measurements: Height: 5\' 9"  (175.3 cm) Weight: (!) 137.4 kg (303 lb) IBW/kg (Calculated) : 66.2 Heparin Dosing Weight: 99.2 Kg  Vital Signs: Temp: 97.8 F (36.6 C) (10/14 1155) Temp Source: Oral (10/14 1155) BP: 149/106 (10/14 1200) Pulse Rate: 78 (10/14 1200)  Labs: Recent Labs    12/26/22 1644 12/26/22 1812 12/26/22 1846 12/26/22 2142 12/26/22 2337 12/27/22 0238 12/27/22 0601 12/27/22 1256  HGB 12.4  --   --   --  12.5  --   --   --   HCT 39.5  --   --   --  39.4  --   --   --   PLT 208  --   --   --  244  --   --   --   APTT  --   --  25  --   --   --   --   --   LABPROT  --   --  13.9  --  14.0  --   --   --   INR  --   --  1.1  --  1.1  --   --   --   HEPARINUNFRC  --   --   --   --   --  0.80*  --  0.62  CREATININE 0.93  --   --   --  0.86  --   --   --   TROPONINIHS 5   < >  --  72* 75*  --  39*  --    < > = values in this interval not displayed.    Estimated Creatinine Clearance: 115.7 mL/min (by C-G formula based on SCr of 0.86 mg/dL).   Medical History: Past Medical History:  Diagnosis Date   Anxiety    Depression    Diabetes mellitus without complication (HCC)    type 2   Hypertension    OSA on CPAP     Medications:  Scheduled:   Chlorhexidine Gluconate Cloth  6 each Topical Daily   citalopram  20 mg Oral Daily   insulin aspart  0-5 Units Subcutaneous QHS   insulin aspart  0-9 Units Subcutaneous TID WC   insulin glargine-yfgn  20 Units Subcutaneous Daily   sodium chloride flush  3 mL Intravenous Q12H   Assessment: 52 YO female presenting 10/13 with shortness of breath and chest pain for several days. CTA PE positive for submassive PE and right heart strain. Not on anticoagulation prior to admission. Pharmacy consulted for heparin dosing.  Today, 12/27/22 Heparin level 0.62 (therapeutic)  CBC: Hgb/plts WNL No  s/sx of bleeding or infusion related concerns reported by RN  Goal of Therapy:  Heparin level 0.3-0.7 units/ml Monitor platelets by anticoagulation protocol: Yes   Plan:  continue heparin gtt at 1500 units/hr  Recheck heparin level in 6h Monitor heparin level, CBC, s/sx of bleeding daily  Karlton Lemon, PharmD Candidate  10/14/20242:09 PM

## 2022-12-27 NOTE — Telephone Encounter (Signed)
4-8 weeks with any sleep provider for re establishing OSA care and f/u of pulmonary embolism

## 2022-12-27 NOTE — Progress Notes (Signed)
PHARMACY - ANTICOAGULATION CONSULT NOTE  Pharmacy Consult for heparin Indication: pulmonary embolus  No Known Allergies  Patient Measurements: Height: 5\' 9"  (175.3 cm) Weight: (!) 137.4 kg (303 lb) IBW/kg (Calculated) : 66.2 Heparin Dosing Weight: 99.2 Kg  Vital Signs: Temp: 98.6 F (37 C) (10/14 0035) Temp Source: Oral (10/14 0035) BP: 131/86 (10/14 0300) Pulse Rate: 81 (10/14 0300)  Labs: Recent Labs    12/26/22 1644 12/26/22 1812 12/26/22 1846 12/26/22 2142 12/26/22 2337 12/27/22 0238  HGB 12.4  --   --   --  12.5  --   HCT 39.5  --   --   --  39.4  --   PLT 208  --   --   --  244  --   APTT  --   --  25  --   --   --   LABPROT  --   --  13.9  --  14.0  --   INR  --   --  1.1  --  1.1  --   HEPARINUNFRC  --   --   --   --   --  0.80*  CREATININE 0.93  --   --   --  0.86  --   TROPONINIHS 5 26*  --  72* 75*  --     Estimated Creatinine Clearance: 115.7 mL/min (by C-G formula based on SCr of 0.86 mg/dL).   Medical History: Past Medical History:  Diagnosis Date   Anxiety    Depression    Diabetes mellitus without complication (HCC)    type 2   Hypertension    OSA on CPAP     Medications:  Scheduled:   Chlorhexidine Gluconate Cloth  6 each Topical Daily   insulin aspart  0-5 Units Subcutaneous QHS   insulin aspart  0-9 Units Subcutaneous TID WC   sodium chloride flush  3 mL Intravenous Q12H   Assessment: 52 YO female presenting 10/13 with shortness of breath and chest pain for several days. CTA PE positive for submassive PE and right heart strain. Not on anticoagulation prior to admission. Pharmacy consulted for heparin dosing.  Today, 12/27/22 Heparin level 0.8- elevated in IV heparin 1800 units/hr Confirmed lab was drawn from opposite arm as heparin infusing CBC: Hgb/plts WNL No s/sx of bleeding or infusion related concerns reported by RN  Goal of Therapy:  Heparin level 0.3-0.7 units/ml Monitor platelets by anticoagulation protocol: Yes    Plan:  Decrease heparin gtt to 1500 units/hr  Recheck heparin level 8h after rate change Monitor heparin level, CBC, s/sx of bleeding daily  Junita Push, PharmD, BCPS 10/14/20243:52 AM

## 2022-12-27 NOTE — Progress Notes (Signed)
NAME:  Brittany Giles, MRN:  [id number], DOB:  1970-04-24, LOS: 1 ADMISSION DATE:  12/26/2022, CONSULTATION DATE:  12/25/22 REFERRING MD:  Dr Darreld Mclean of [facility], CHIEF COMPLAINT:  Submassive PE    History of Present Illness:  52 year female with T2DM, Hypertension., Depression/Anxiety, OSA not using cPAP. HEre for dyspnea x 2 days and chest discomfort and also edema right lower R > L.  Diagnosed with submassive pulmonary embolism with RV LV ratio greater than 1.2.  In the emergency department patient did have a syncopal /presyncopal episode.  Denies any fever chills or cough.  There are some right sided somewhat pleuritic pain.  Admitted by the hospitalist started on IV heparin.  Biomarkers are essentially normal [see below].  Moved to the ICU stepdown unit.  Saturating 94% on 2 L nasal cannula with normal heart rate.     Latest Reference Range & Units 12/26/22 16:44 12/26/22 16:45 12/26/22 18:12  B Natriuretic Peptide 0.0 - 100.0 pg/mL  18.8   Troponin I (High Sensitivity) <18 ng/L 5  26 (H)    Past Medical History:    has a past medical history of Anxiety, Depression, Diabetes mellitus without complication (HCC), Hypertension, and OSA on CPAP.   reports that she has never smoked. She has never used smokeless tobacco.  Past Surgical History:  Procedure Laterality Date   ABDOMINAL HYSTERECTOMY     BREAST BIOPSY Left    2010 benign   CESAREAN SECTION  2011   CHOLECYSTECTOMY      No Known Allergies   There is no immunization history on file for this patient.  Family History  Problem Relation Age of Onset   Breast cancer Maternal Grandmother 37     Current Facility-Administered Medications:    acetaminophen (TYLENOL) tablet 1,000 mg, 1,000 mg, Oral, Q6H PRN, 1,000 mg at 12/26/22 2155 **OR** acetaminophen (TYLENOL) suppository 650 mg, 650 mg, Rectal, Q6H PRN, Charlsie Quest, MD   Chlorhexidine Gluconate Cloth 2 % PADS 6 each, 6 each, Topical, Daily, Charlsie Quest, MD, 6 each at 12/26/22 2131   heparin ADULT infusion 100 units/mL (25000 units/256mL), 1,500 Units/hr, Intravenous, Continuous, Phylliss Blakes, [facility], Last Rate: 15 mL/hr at 12/27/22 0821, 1,500 Units/hr at 12/27/22 9528   hydrALAZINE (APRESOLINE) injection 10 mg, 10 mg, Intravenous, Q4H PRN, Allena Katz, Vishal R, MD   insulin aspart (novoLOG) injection 0-5 Units, 0-5 Units, Subcutaneous, QHS, Patel, Vishal R, MD   insulin aspart (novoLOG) injection 0-9 Units, 0-9 Units, Subcutaneous, TID WC, Charlsie Quest, MD, 3 Units at 12/27/22 0745   ondansetron (ZOFRAN) tablet 4 mg, 4 mg, Oral, Q6H PRN **OR** ondansetron (ZOFRAN) injection 4 mg, 4 mg, Intravenous, Q6H PRN, Charlsie Quest, MD   Oral care mouth rinse, 15 mL, Mouth Rinse, PRN, Allena Katz, Vishal R, MD   oxyCODONE (Oxy IR/ROXICODONE) immediate release tablet 5 mg, 5 mg, Oral, Q4H PRN, Allena Katz, Vishal R, MD   senna-docusate (Senokot-S) tablet 1 tablet, 1 tablet, Oral, QHS PRN, Allena Katz, Vishal R, MD   sodium chloride flush (NS) 0.9 % injection 3 mL, 3 mL, Intravenous, Q12H, Darreld Mclean R, MD, 3 mL at 12/26/22 2131     Significant Hospital Events:  12/26/2022 - admit   Interim History / Subjective:  Feels better; no further dizziness although she admits this only occurred when she tried to stand up.  Objective   Blood pressure (!) 136/100, pulse 79, temperature 98.5 F (36.9 C), temperature source Oral, resp. rate 17, height  5\' 9"  (1.753 m), weight (!) 137.4 kg, SpO2 98%.        Intake/Output Summary (Last 24 hours) at 12/27/2022 0824 Last data filed at 12/27/2022 2130 Gross per 24 hour  Intake 491.05 ml  Output --  Net 491.05 ml   Filed Weights   12/26/22 1559  Weight: (!) 137.4 kg    Examination: No distress [place] 4 Moves to command Heart sounds regular Maybe faint R edema Aox3  Echo pending Mild trop leak noted  Resolved Hospital Problem list   x  Assessment & Plan:  Submassive PE, class III intermediate risk-  RV dysfunction is exaggerated by baseline OSA not on BIPAP.  No hepatic contrast reflux on CTA.  I think bedrest x 24h with heparin drip with plans for NoAC tomorrow is reasonable.  - F/u echo  - Heparin gtt and bedrest through tonight; transition to NoAC tomorrow  - Walking pulse ox and orthostatics in AM; if tolerates both can go home with OP f/u; if recurrence of dizziness or persistent O2 need can re-consider EKOS  - This is a nonprovoked submassive PE, recommendations for these are to consider lifelong AC as there is up to a 40% recurrence rate with cessation  Myrla Halsted MD PCCM

## 2022-12-27 NOTE — Inpatient Diabetes Management (Addendum)
Inpatient Diabetes Program Recommendations  AACE/ADA: New Consensus Statement on Inpatient Glycemic Control (2015)  Target Ranges:  Prepandial:   less than 140 mg/dL      Peak postprandial:   less than 180 mg/dL (1-2 hours)      Critically ill patients:  140 - 180 mg/dL   Lab Results  Component Value Date   GLUCAP 205 (H) 12/27/2022   HGBA1C 10.5 (H) 12/26/2022    Review of Glycemic Control  Latest Reference Range & Units 12/26/22 15:57 12/26/22 21:27 12/27/22 07:37  Glucose-Capillary 70 - 99 mg/dL 119 (H) 147 (H) 829 (H)   Diabetes history: DM 2 Outpatient Diabetes medications:  Metformin 500 mg bid- not taking Current orders for Inpatient glycemic control:  Novolog 0-9 units tid with meals and HS  Inpatient Diabetes Program Recommendations:    Note elevated A1C (average blood sugar of 254 mg/dL).   While in the hospital, please consider adding Semglee 20 units daily. Will see patient today to discuss A1C.  Addendum 1430- Spoke with patient at bedside. She states she was not taking any meds for her diabetes. She is ordered Ozempic 1 mg weekly but could not tolerate the GI effects of the higher dose.  She states that when she was taking Ozempic 0.5 mg weekly, she was able to tolerate the dose and her A1C was down to 6.7%.  May consider restarting lower dose of Ozempic at d/c to help with blood sugars or she will need insulin.  Discussed current A1C with patient and her family.  Needs close f/u with her PCP.   Discussed importance of better control of her DM for prevention of complications.  Patient and family verbalized understanding.   Thanks, Beryl Meager, RN, BC-ADM Inpatient Diabetes Coordinator Pager [phone] (8a-5p)

## 2022-12-27 NOTE — TOC Benefit Eligibility Note (Signed)
Patient Product/process development scientist completed.    The patient is insured through [facility]. Patient has [employer], may use a copay card, and/or apply for patient assistance if available.    Ran test claim for Eliquis 5 mg and the current 30 day co-pay is $47.00.  Ran test claim for Xarelto 20 mg and the current 30 day co-pay is $47.00.   This test claim was processed through [facility]- copay amounts may vary at other pharmacies due to pharmacy/plan contracts, or as the patient moves through the different stages of their insurance plan.     Roland Earl, CPHT Pharmacy Technician III Certified Patient Advocate [facility] Pharm[facility] Patient Advocate Team Direct Number: [phone]  Fax: [phone]

## 2022-12-27 NOTE — Plan of Care (Signed)
  Problem: Consults Goal: Venous Thromboembolism Patient Education Description: See Patient Education Module for education specifics. Outcome: Progressing Goal: Diagnosis - Venous Thromboembolism (VTE) Description: Choose a selection Outcome: Progressing Note: PE (Pulmonary Embolism) Goal: Pharmacy Consult for anticoagulation Outcome: Progressing Goal: Skin Care Protocol Initiated - if Braden Score 18 or less Description: If consults are not indicated, leave blank or document N/A Outcome: Progressing Goal: Nutrition Consult-if indicated Outcome: Progressing Goal: Diabetes Guidelines if Diabetic/Glucose > 140 Description: If diabetic or lab glucose is > 140 mg/dl - Initiate Diabetes/Hyperglycemia Guidelines & Document Interventions  Outcome: Progressing   Problem: Phase II Progression Outcomes Goal: Therapeutic drug levels for anticoagulation Outcome: Progressing Goal: 02 sats trending upward/stable (PE) Outcome: Progressing Goal: Discharge plan established Outcome: Progressing Goal: Tolerating diet Outcome: Progressing Goal: Other Phase II Outcomes/Goals Outcome: Progressing

## 2022-12-27 NOTE — Progress Notes (Addendum)
PROGRESS NOTE    Brittany Giles  ZOX:[id number] DOB: 07/28/1970 DOA: 12/26/2022 PCP: Irena Reichmann, DO    Chief Complaint  Patient presents with   Shortness of Breath   Chest Pain    Brief Narrative:  Patient is a pleasant 52 year old female history of type 2 diabetes, OSA on CPAP (noncompliant), depression/anxiety, hypertension presented to the ED with a 2-day history of worsening shortness of breath on minimal exertion, lower extremity edema, chest pain.  Patient on presentation in the ED had a syncopal episode.  Patient assessed in the ED CT angiogram done concerning for submassive pulmonary embolism with right ventricular strain.  Patient admitted to the stepdown unit, placed on IV heparin, PCCM consulted.   Assessment & Plan:   Principal Problem:   Bilateral pulmonary embolism (HCC) Active Problems:   Acute respiratory failure with hypoxia (HCC)   Type 2 diabetes mellitus (HCC)   Hypertension associated with diabetes (HCC)   Hypokalemia   OSA (obstructive sleep apnea)  #1 acute hypoxic respiratory failure secondary to bilateral PE/submassive PE -Patient presenting with sudden onset shortness of breath and chest pain x 2 days, noted to have a syncopal episode in the ED. -Patient with no recent long car rides or travel, no recent surgeries, no prior history of PE. -CT angiogram chest done with acute bilateral PE with CT evidence of right heart strain. -Patient on presentation noted to have required 4 L supplemental O2. -Cardiac enzymes initially elevated but trending back down. -Hemodynamically currently stable. -Lower extremity Dopplers pending.  2D echo pending. -Continue IV heparin. -Patient seen in consultation by PCCM who are recommending continuation of heparin drip, bedrest through today, ambulatory sats and orthostatics in the morning and if stable could likely transition to a NOAC tomorrow.  Per PCCM if recurrence of dizziness or persistent hypoxia may need  to reconsider EKOS. -Per PCCM as this is an unprovoked submassive PE recommendations are to consider lifelong anticoagulation as there is a 40% of recurrence rate with cessation. -PCCM following and appreciate input and recommendations.  2.  Diabetes mellitus type 2 -Hemoglobin A1c 10.5. -Patient states she is on Ozempic as well as metformin. -States she ran out of her metformin 3 months ago, has not seen PCP in over a year and as such as to be seen by PCP prior to refills being placed. -Start Semglee 20 units daily. -Continue SSI during this hospitalization. -Consult with diabetic coordinator.  3.  OSA -Patient noted to be noncompliant with CPAP. -Patient states her CPAP machine is old does not work as well and that is why she is noncompliant however states she is willing to follow-up for repeat sleep study and to be set up for another CPAP machine. -Outpatient follow-up with pulmonary for further evaluation and repeat sleep studies.  4.  Hypokalemia -Repleted.  Potassium at 3.8.  5.  Hypertension -Not on any meds for almost 3 months. -BP currently stable will monitor for now. -Hydralazine as needed.  6.  Left upper lobe lung nodule versus developing infarct -CT showed an 8 mm nodule in the left upper lobe versus an area of developing infarct. -Repeat noncontrast CT at 6 to 12 months recommended. -Outpatient follow-up with pulmonary.   DVT prophylaxis: Heparin Code Status: Full Family Communication: Updated patient.  No family at bedside. Disposition: TBD  Status is: Inpatient Remains inpatient appropriate because: Severity of illness   Consultants:  PCCM: Dr. Katrinka Blazing 12/27/2022  Procedures:  CT head 12/27/2022 CT angiogram chest 12/26/2022 Chest x-ray  12/26/2022 Lower extremity Dopplers pending 2D echo pending  Antimicrobials:  Anti-infectives (From admission, onward)    None         Subjective: Patient laying in bed.  States shortness of breath has improved  since admission.  Chest pain improved.  No nausea or vomiting.  Still having some soft/loose stools per RN.  Denies any significant pain or swelling in the legs.  States his CPAP machine was very old and does not work properly and needs a new CPAP machine is the reason why she is noncompliant with it.  States she ran out of her metformin 3 months ago and unable to get a refill until seen PCP.  Objective: Vitals:   12/27/22 0600 12/27/22 0700 12/27/22 0740 12/27/22 0800  BP: (!) 141/87 129/86  (!) 136/100  Pulse: 85 79  79  Resp: 15 17    Temp:   98.5 F (36.9 C)   TempSrc:   Oral   SpO2: 99% 94%  98%  Weight:      Height:        Intake/Output Summary (Last 24 hours) at 12/27/2022 0959 Last data filed at 12/27/2022 0926 Gross per 24 hour  Intake 731.05 ml  Output --  Net 731.05 ml   Filed Weights   12/26/22 1559  Weight: (!) 137.4 kg    Examination:  General exam: Appears calm and comfortable  Respiratory system: Clear to auscultation. Respiratory effort normal. Cardiovascular system: S1 & S2 heard, RRR. No JVD, murmurs, rubs, gallops or clicks. No pedal edema. Gastrointestinal system: Abdomen is nondistended, soft and nontender. No organomegaly or masses felt. Normal bowel sounds heard. Central nervous system: Alert and oriented. No focal neurological deficits. Extremities: Symmetric 5 x 5 power. Skin: No rashes, lesions or ulcers Psychiatry: Judgement and insight appear normal. Mood & affect appropriate.     Data Reviewed: I have personally reviewed following labs and imaging studies  CBC: Recent Labs  Lab 12/26/22 1644 12/26/22 2337  WBC 9.0 7.9  HGB 12.4 12.5  HCT 39.5 39.4  MCV 89.4 88.1  PLT 208 244    Basic Metabolic Panel: Recent Labs  Lab 12/26/22 1644 12/26/22 2337  NA 136 135  K 3.4* 3.8  CL 100 103  CO2 25 24  GLUCOSE 227* 178*  BUN 12 12  CREATININE 0.93 0.86  CALCIUM 9.5 9.3  MG  --  1.8  PHOS  --  3.7    GFR: Estimated Creatinine  Clearance: 115.7 mL/min (by C-G formula based on SCr of 0.86 mg/dL).  Liver Function Tests: No results for input(s): "AST", "ALT", "ALKPHOS", "BILITOT", "PROT", "ALBUMIN" in the last 168 hours.  CBG: Recent Labs  Lab 12/26/22 1557 12/26/22 2127 12/27/22 0737  GLUCAP 178* 185* 205*     Recent Results (from the past 240 hour(s))  MRSA Next Gen by PCR, Nasal     Status: None   Collection Time: 12/26/22  9:51 PM   Specimen: Nasal Mucosa; Nasal Swab  Result Value Ref Range Status   MRSA by PCR Next Gen NOT DETECTED NOT DETECTED Final    Comment: (NOTE) The GeneXpert MRSA Assay (FDA approved for NASAL specimens only), is one component of a comprehensive MRSA colonization surveillance program. It is not intended to diagnose MRSA infection nor to guide or monitor treatment for MRSA infections. Test performance is not FDA approved in patients less than 48 years old. Performed at [facility], [address], [place], [place] [address]  Radiology Studies: CT HEAD WO CONTRAST ( )  Result Date: 12/27/2022 CLINICAL DATA:  Headache EXAM: CT HEAD WITHOUT CONTRAST TECHNIQUE: Contiguous axial images were obtained from the base of the skull through the vertex without intravenous contrast. RADIATION DOSE REDUCTION: This exam was performed according to the departmental dose-optimization program which includes automated exposure control, adjustment of the mA and/or kV according to patient size and/or use of iterative reconstruction technique. COMPARISON:  None Available. FINDINGS: Brain: No mass,hemorrhage or extra-axial collection. Normal appearance of the parenchyma and CSF spaces. Vascular: No hyperdense vessel or unexpected vascular calcification. Skull: The visualized skull base, calvarium and extracranial soft tissues are normal. Sinuses/Orbits: No fluid levels or advanced mucosal thickening of the visualized paranasal sinuses. No mastoid or middle ear effusion.  Normal orbits. IMPRESSION: Normal head CT. Electronically Signed   By: Deatra Robinson M.D.   On: 12/27/2022 03:03   CT Angio Chest PE W and/or Wo Contrast  Result Date: 12/26/2022 CLINICAL DATA:  Shortness of breath and chest pain. Hyperglycemia. Concern for pulmonary embolism. EXAM: CT ANGIOGRAPHY CHEST WITH CONTRAST TECHNIQUE: Multidetector CT imaging of the chest was performed using the standard protocol during bolus administration of intravenous contrast. Multiplanar CT image reconstructions and MIPs were obtained to evaluate the vascular anatomy. RADIATION DOSE REDUCTION: This exam was performed according to the departmental dose-optimization program which includes automated exposure control, adjustment of the mA and/or kV according to patient size and/or use of iterative reconstruction technique. CONTRAST:  75mL OMNIPAQUE IOHEXOL 350 MG/ML SOLN COMPARISON:  Chest radiograph dated 12/26/2022. FINDINGS: Evaluation is limited due to streak artifact caused by patient's arms. Cardiovascular: Top-normal cardiac size. No pericardial effusion. The thoracic aorta is unremarkable. Bilateral pulmonary artery emboli involving the lobar branches of the upper and lower lobes. There is right heart straining with RV/LV ratio in the range of 1.1-1.3. Mediastinum/Nodes: No hilar or mediastinal adenopathy. The esophagus is grossly unremarkable. No mediastinal fluid collection. Lungs/Pleura: There is an 8 mm nodule in the left upper lobe versus an area of developing infarct. No consolidative changes. There is no pleural effusion or pneumothorax. The central airways are patent. Upper Abdomen: Fatty liver.  Cholecystectomy. Musculoskeletal: Degenerative changes of the spine. No acute osseous pathology. Review of the MIP images confirms the above findings. IMPRESSION: 1. Positive for acute PE with CT evidence of right heart strain (RV/LV Ratio = 1.2) consistent with at least submassive (intermediate risk) PE. The presence of  right heart strain has been associated with an increased risk of morbidity and mortality. Please refer to the "Code PE Focused" order set in EPIC. 2. An 8 mm nodule in the left upper lobe versus an area of developing infarct. Non-contrast chest CT at 6-12 months is recommended. If the nodule is stable at time of repeat CT, then future CT at 18-24 months (from today's scan) is considered optional for low-risk patients, but is recommended for high-risk patients. This recommendation follows the consensus statement: Guidelines for Management of Incidental Pulmonary Nodules Detected on CT Images: From the Fleischner Society 2017; Radiology 2017; 284:228-243. 3. Fatty liver. These results were called by telephone at the time of interpretation on 12/26/2022 at 6:10 pm to provider [facility] ROEMHILDT , who verbally acknowledged these results. Electronically Signed   By: Elgie Collard M.D.   On: 12/26/2022 18:13   DG Chest 2 View  Result Date: 12/26/2022 CLINICAL DATA:  Chest pain, shortness of breath EXAM: CHEST - 2 VIEW COMPARISON:  None Available. FINDINGS: The heart size and mediastinal contours  are within normal limits. Both lungs are clear. The visualized skeletal structures are unremarkable. IMPRESSION: No acute abnormality of the lungs. Electronically Signed   By: Jearld Lesch M.D.   On: 12/26/2022 17:17        Scheduled Meds:  Chlorhexidine Gluconate Cloth  6 each Topical Daily   citalopram  20 mg Oral Daily   insulin aspart  0-5 Units Subcutaneous QHS   insulin aspart  0-9 Units Subcutaneous TID WC   sodium chloride flush  3 mL Intravenous Q12H   Continuous Infusions:  heparin 1,500 Units/hr (12/27/22 0821)     LOS: 1 day    Time spent: 45 minutes    Ramiro Harvest, MD [facility]   To contact the attending provider between 7A-7P or the covering provider during after hours 7P-7A, please log into the web site [email or web address] and access using universal [place] password for  that web site. If you do not have the password, please call the hospital operator.  12/27/2022, 9:59 AM

## 2022-12-27 NOTE — Progress Notes (Signed)
Pharmacy Brief  Note - Evening Anticoagulation Follow Up:  Pt is a 60 yoF currently on heparin infusion for treatment of an acute submassive PE. For full history, see note by Len Childs, PharmD from earlier today.   Assessment: Confirmatory heparin level = 0.71 is slightly supratherapeutic on heparin infusion of 1500 units/hr CBC: WNL Confirmed with RN - no signs of bleeding  Goal: Heparin level 0.3 - 0.7 units/mL  Plan: Decrease heparin infusion to 1400 units/hr Heparin level 6-8 hours after rate change CBC, heparin level daily Monitor for signs of bleeding  Cindi Carbon, PharmD 12/27/22 4:30 PM

## 2022-12-27 NOTE — Progress Notes (Signed)
Bilateral lower extremity venous duplex has been completed. Preliminary results can be found in CV Proc through chart review.   12/27/22 10:02 AM Olen Cordial RVT

## 2022-12-28 ENCOUNTER — Other Ambulatory Visit (HOSPITAL_COMMUNITY): Payer: Self-pay

## 2022-12-28 ENCOUNTER — Inpatient Hospital Stay (HOSPITAL_COMMUNITY): Payer: BC Managed Care – PPO

## 2022-12-28 DIAGNOSIS — I2699 Other pulmonary embolism without acute cor pulmonale: Secondary | ICD-10-CM | POA: Diagnosis not present

## 2022-12-28 DIAGNOSIS — I2609 Other pulmonary embolism with acute cor pulmonale: Secondary | ICD-10-CM

## 2022-12-28 DIAGNOSIS — E1159 Type 2 diabetes mellitus with other circulatory complications: Secondary | ICD-10-CM | POA: Diagnosis not present

## 2022-12-28 DIAGNOSIS — J9601 Acute respiratory failure with hypoxia: Secondary | ICD-10-CM | POA: Diagnosis not present

## 2022-12-28 DIAGNOSIS — E876 Hypokalemia: Secondary | ICD-10-CM | POA: Diagnosis not present

## 2022-12-28 LAB — CBC
HCT: 41 % (ref 36.0–46.0)
Hemoglobin: 12.8 g/dL (ref 12.0–15.0)
MCH: 27.8 pg (ref 26.0–34.0)
MCHC: 31.2 g/dL (ref 30.0–36.0)
MCV: 89.1 fL (ref 80.0–100.0)
Platelets: 239 10*3/uL (ref 150–400)
RBC: 4.6 MIL/uL (ref 3.87–5.11)
RDW: 15.4 % (ref 11.5–15.5)
WBC: 5.2 10*3/uL (ref 4.0–10.5)
nRBC: 0 % (ref 0.0–0.2)

## 2022-12-28 LAB — BASIC METABOLIC PANEL
Anion gap: 10 (ref 5–15)
BUN: 17 mg/dL (ref 6–20)
CO2: 24 mmol/L (ref 22–32)
Calcium: 9.3 mg/dL (ref 8.9–10.3)
Chloride: 102 mmol/L (ref 98–111)
Creatinine, Ser: 0.85 mg/dL (ref 0.44–1.00)
GFR, Estimated: >60 mL/min (ref >60)
Glucose, Bld: 188 mg/dL — ABNORMAL HIGH (ref 70–99)
Potassium: 3.8 mmol/L (ref 3.5–5.1)
Sodium: 136 mmol/L (ref 135–145)

## 2022-12-28 LAB — ECHOCARDIOGRAM COMPLETE
AR max vel: 2.7 cm2
AV Peak grad: 3.2 mm[Hg]
Ao pk vel: 0.9 m/s
Height: 69 in
S' Lateral: 2.3 cm
Weight: 4848 [oz_av]

## 2022-12-28 LAB — GLUCOSE, CAPILLARY
Glucose-Capillary: 167 mg/dL — ABNORMAL HIGH (ref 70–99)
Glucose-Capillary: 149 mg/dL — ABNORMAL HIGH (ref 70–99)
Glucose-Capillary: 191 mg/dL — ABNORMAL HIGH (ref 70–99)
Glucose-Capillary: 177 mg/dL — ABNORMAL HIGH (ref 70–99)

## 2022-12-28 LAB — HEPARIN LEVEL (UNFRACTIONATED): Heparin Unfractionated: 0.67 [IU]/mL (ref 0.30–0.70)

## 2022-12-28 MED ORDER — APIXABAN (ELIQUIS) VTE STARTER PACK (10MG AND 5MG)
ORAL_TABLET | ORAL | 0 refills | Status: DC
  Filled 2022-12-28: qty 74, 30d supply, fill #0

## 2022-12-28 MED ORDER — PERFLUTREN LIPID MICROSPHERE
1.0000 mL | INTRAVENOUS | Status: AC | PRN
  Administered 2022-12-28: 5 mL via INTRAVENOUS

## 2022-12-28 MED ORDER — APIXABAN 5 MG PO TABS
10.0000 mg | ORAL_TABLET | Freq: Two times a day (BID) | ORAL | Status: DC
  Administered 2022-12-28 – 2022-12-29 (×4): 10 mg via ORAL
  Filled 2022-12-28 (×4): qty 2

## 2022-12-28 MED ORDER — APIXABAN 5 MG PO TABS: 5.0000 mg | ORAL_TABLET | Freq: Two times a day (BID) | ORAL | Status: DC

## 2022-12-28 NOTE — TOC CM/SW Note (Signed)
Transition of Care [facility]) - Inpatient Brief Assessment   Patient Details  Name: Brittany Giles MRN: [id number] Date of Birth: 1970-08-30  [facility]) CM/SW Contact:    Darleene Cleaver, LCSW Phone Number: 12/28/2022, 3:00 PM   Clinical Narrative:  Patient has been reviewed, and does not have any SDOH needs.  Patient plans to return back home.   Transition of Care Asessment: Insurance and Status: Insurance coverage has been reviewed Patient has primary care physician: Yes Home environment has been reviewed: Yes Prior level of function:: Indep Prior/Current Home Services: No current home services Social Determinants of Health Reivew: SDOH reviewed no interventions necessary Readmission risk has been reviewed: Yes Transition of care needs: no transition of care needs at this time

## 2022-12-28 NOTE — Progress Notes (Signed)
SATURATION QUALIFICATIONS:   Patient Saturations on 3 Liters of oxygen while Ambulating = 98%  Please briefly explain why patient needs home oxygen: Needs oxygen to ambulate due to clot in the lungs

## 2022-12-28 NOTE — Progress Notes (Signed)
PHARMACY - ANTICOAGULATION CONSULT NOTE  Pharmacy Consult for heparin Indication: pulmonary embolus  No Known Allergies  Patient Measurements: Height: 5\' 9"  (175.3 cm) Weight: (!) 137.4 kg (303 lb) IBW/kg (Calculated) : 66.2 Heparin Dosing Weight: 99.2 Kg  Vital Signs: Temp: 98 F (36.7 C) (10/15 0750) Temp Source: Axillary (10/15 0750) BP: 138/59 (10/15 0900) Pulse Rate: 87 (10/15 0900)  Labs: Recent Labs    12/26/22 1644 12/26/22 1812 12/26/22 1846 12/26/22 2142 12/26/22 2337 12/27/22 0238 12/27/22 0601 12/27/22 1256 12/27/22 1927 12/28/22 0803  HGB 12.4  --   --   --  12.5  --   --   --  12.7 12.8  HCT 39.5  --   --   --  39.4  --   --   --  40.4 41.0  PLT 208  --   --   --  244  --   --   --  232 239  APTT  --   --  25  --   --   --   --   --   --   --   LABPROT  --   --  13.9  --  14.0  --   --   --   --   --   INR  --   --  1.1  --  1.1  --   --   --   --   --   HEPARINUNFRC  --   --   --   --   --    < >  --  0.62 0.71* 0.67  CREATININE 0.93  --   --   --  0.86  --   --   --   --  0.85  TROPONINIHS 5   < >  --  72* 75*  --  39*  --   --   --    < > = values in this interval not displayed.    Estimated Creatinine Clearance: 117.1 mL/min (by C-G formula based on SCr of 0.85 mg/dL).   Medical History: Past Medical History:  Diagnosis Date   Anxiety    Depression    Diabetes mellitus without complication (HCC)    type 2   Hypertension    OSA on CPAP     Medications:  Scheduled:   Chlorhexidine Gluconate Cloth  6 each Topical Daily   citalopram  20 mg Oral Daily   insulin aspart  0-5 Units Subcutaneous QHS   insulin aspart  0-9 Units Subcutaneous TID WC   insulin glargine-yfgn  20 Units Subcutaneous Daily   sodium chloride flush  3 mL Intravenous Q12H   Assessment: 52 YO female presenting 10/13 with shortness of breath and chest pain for several days. CTA PE positive for submassive PE and right heart strain. Not on anticoagulation prior to  admission. Pharmacy consulted for heparin dosing.  Today, 12/28/22 Heparin level 0.67 (therapeutic)  CBC: Hgb/plts WNL No s/sx of bleeding or infusion related concerns reported by RN  Goal of Therapy:  Heparin level 0.3-0.7 units/ml Monitor platelets by anticoagulation protocol: Yes   Plan:  continue heparin gtt at 1400 units/hr  Monitor heparin level, CBC, s/sx of bleeding daily  Karlton Lemon, PharmD Candidate  10/15/20249:57 AM

## 2022-12-28 NOTE — Progress Notes (Signed)
Echocardiogram 2D Echocardiogram has been performed.  Brittany Giles 12/28/2022, 9:39 AM

## 2022-12-28 NOTE — Plan of Care (Signed)
Problem: Coping: Goal: Ability to adjust to condition or change in health will improve Outcome: Progressing   Problem: Fluid Volume: Goal: Ability to maintain a balanced intake and output will improve Outcome: Progressing   Problem: Health Behavior/Discharge Planning: Goal: Ability to manage health-related needs will improve Outcome: Progressing

## 2022-12-28 NOTE — Discharge Instructions (Signed)
Information on my medicine - ELIQUIS (apixaban)  This medication education was reviewed with me or my healthcare representative as part of my discharge preparation.    Why was Eliquis prescribed for you? Eliquis was prescribed to treat blood clots that may have been found in the veins of your legs (deep vein thrombosis) or in your lungs (pulmonary embolism) and to reduce the risk of them occurring again.  What do You need to know about Eliquis ? The starting dose is 10 mg (two 5 mg tablets) taken TWICE daily for the FIRST SEVEN (7) DAYS, then on  01/04/2023  the dose is reduced to ONE 5 mg tablet taken TWICE daily.  Eliquis may be taken with or without food.   Try to take the dose about the same time in the morning and in the evening. If you have difficulty swallowing the tablet whole please discuss with your pharmacist how to take the medication safely.  Take Eliquis exactly as prescribed and DO NOT stop taking Eliquis without talking to the doctor who prescribed the medication.  Stopping may increase your risk of developing a new blood clot.  Refill your prescription before you run out.  After discharge, you should have regular check-up appointments with your healthcare provider that is prescribing your Eliquis.    What do you do if you miss a dose? If a dose of ELIQUIS is not taken at the scheduled time, take it as soon as possible on the same day and twice-daily administration should be resumed. The dose should not be doubled to make up for a missed dose.  Important Safety Information A possible side effect of Eliquis is bleeding. You should call your healthcare provider right away if you experience any of the following: Bleeding from an injury or your nose that does not stop. Unusual colored urine (red or dark brown) or unusual colored stools (red or black). Unusual bruising for unknown reasons. A serious fall or if you hit your head (even if there is no bleeding).  Some  medicines may interact with Eliquis and might increase your risk of bleeding or clotting while on Eliquis. To help avoid this, consult your healthcare provider or pharmacist prior to using any new prescription or non-prescription medications, including herbals, vitamins, non-steroidal anti-inflammatory drugs (NSAIDs) and supplements.  This website has more information on Eliquis (apixaban): http://www.eliquis.com/eliquis/home

## 2022-12-28 NOTE — Progress Notes (Addendum)
PROGRESS NOTE    Brittany Giles  JXB:[id number] DOB: 10-05-1970 DOA: 12/26/2022 PCP: Irena Reichmann, DO    Chief Complaint  Patient presents with   Shortness of Breath   Chest Pain    Brief Narrative:  Patient is a pleasant 52 year old female history of type 2 diabetes, OSA on CPAP (noncompliant), depression/anxiety, hypertension presented to the ED with a 2-day history of worsening shortness of breath on minimal exertion, lower extremity edema, chest pain.  Patient on presentation in the ED had a syncopal episode.  Patient assessed in the ED CT angiogram done concerning for submassive pulmonary embolism with right ventricular strain.  Patient admitted to the stepdown unit, placed on IV heparin, PCCM consulted.   Assessment & Plan:   Principal Problem:   Bilateral pulmonary embolism (HCC) Active Problems:   Acute respiratory failure with hypoxia (HCC)   Type 2 diabetes mellitus (HCC)   Hypertension associated with diabetes (HCC)   Hypokalemia   OSA (obstructive sleep apnea)  #1 acute hypoxic respiratory failure secondary to bilateral PE/submassive PE -Patient presenting with sudden onset shortness of breath and chest pain x 2 days, noted to have a syncopal episode in the ED. -Patient with no recent long car rides or travel, no recent surgeries, no prior history of PE. -CT angiogram chest done with acute bilateral PE with CT evidence of right heart strain. -Patient on presentation noted to have required 4 L supplemental O2. -Cardiac enzymes initially elevated but trending back down. -Hemodynamically currently stable. -Lower extremity Dopplers negative for DVT.   -2D echo pending.   -Orthostatics done this morning and patient currently asymptomatic with no significant orthostasis.   -Ambulatory sats pending.   -Transition from IV heparin to Eliquis.  -Patient seen in consultation by PCCM who are recommending continuation of heparin drip yesterday with monitoring of  ambulatory sats today and orthostasis and could likely transition to NOAC today. - Per PCCM if recurrence of dizziness or persistent hypoxia may need to reconsider EKOS. -Per PCCM as this is an unprovoked submassive PE recommendations are to consider lifelong anticoagulation as there is a 40% of recurrence rate with cessation. -Outpatient follow-up with pulmonary.  2.  Diabetes mellitus type 2 -Hemoglobin A1c 10.5. -Patient states she is on Ozempic as well as metformin. -States she ran out of her metformin 3 months ago, has not seen PCP in over a year and as such as to be seen by PCP prior to refills being placed. -CBG 167 this morning. -Continue Semglee 20 units daily, SSI. -Diabetes coordinator following. -Will need prescriptions for Ozempic and metformin on discharge likely Ozempic at a lower dose of 0.5 mg daily.  3.  OSA -Patient noted to be noncompliant with CPAP. -Patient states her CPAP machine is old does not work as well and that is why she is noncompliant however states she is willing to follow-up for repeat sleep study and to be set up for another CPAP machine. -Outpatient follow-up with pulmonary for further evaluation and repeat sleep studies.  4.  Hypokalemia -Repleted.  5.  Hypertension -Not on any meds for almost 3 months. -BP currently stable.   -Hydralazine as needed.   6.  Left upper lobe lung nodule versus developing infarct -CT showed an 8 mm nodule in the left upper lobe versus an area of developing infarct. -Repeat noncontrast CT at 6 to 12 months recommended. -Outpatient follow-up with pulmonary.   DVT prophylaxis: Heparin >>>> Eliquis Code Status: Full Family Communication: Updated patient, husband, sister  at bedside.. Disposition: Transfer to telemetry.  Transition to oral DOAC's likely discharge home tomorrow if remains stable.   Status is: Inpatient Remains inpatient appropriate because: Severity of illness   Consultants:  PCCM: Dr. Katrinka Blazing  12/27/2022  Procedures:  CT head 12/27/2022 CT angiogram chest 12/26/2022 Chest x-ray 12/26/2022 Lower extremity Dopplers 12/27/2022 2D echo pending  Antimicrobials:  Anti-infectives (From admission, onward)    None         Subjective: Patient up in room getting orthostatics checked.  Denies any lightheadedness or dizziness.  No further syncopal episodes.  No chest pain.  Feels shortness of breath has improved.  Husband and sister at bedside.    Objective: Vitals:   12/28/22 0750 12/28/22 0800 12/28/22 0900 12/28/22 1000  BP:  (!) 160/133 (!) 138/59 (!) 138/100  Pulse:  80 87 (!) 107  Resp:    (!) 25  Temp: 98 F (36.7 C)     TempSrc: Axillary     SpO2:  95% 98% 100%  Weight:      Height:        Intake/Output Summary (Last 24 hours) at 12/28/2022 1028 Last data filed at 12/28/2022 0948 Gross per 24 hour  Intake 669.62 ml  Output --  Net 669.62 ml   Filed Weights   12/26/22 1559  Weight: (!) 137.4 kg    Examination:  General exam: NAD. Respiratory system: Lungs clear to auscultation bilaterally.  No wheezes, no crackles, no rhonchi.  Fair air movement.  Speaking in full sentences.   Cardiovascular system: Regular rate rhythm no murmurs rubs or gallops.  No JVD.  No pitting lower extremity edema.  Gastrointestinal system: Abdomen is soft, nontender, nondistended, positive bowel sounds.  No rebound.  No guarding.   Central nervous system: Alert and oriented. No focal neurological deficits. Extremities: Symmetric 5 x 5 power. Skin: No rashes, lesions or ulcers Psychiatry: Judgement and insight appear normal. Mood & affect appropriate.     Data Reviewed: I have personally reviewed following labs and imaging studies  CBC: Recent Labs  Lab 12/26/22 1644 12/26/22 2337 12/27/22 1927 12/28/22 0803  WBC 9.0 7.9 5.1 5.2  HGB 12.4 12.5 12.7 12.8  HCT 39.5 39.4 40.4 41.0  MCV 89.4 88.1 88.4 89.1  PLT 208 244 232 239    Basic Metabolic Panel: Recent Labs   Lab 12/26/22 1644 12/26/22 2337 12/28/22 0803  NA 136 135 136  K 3.4* 3.8 3.8  CL 100 103 102  CO2 25 24 24   GLUCOSE 227* 178* 188*  BUN 12 12 17   CREATININE 0.93 0.86 0.85  CALCIUM 9.5 9.3 9.3  MG  --  1.8  --   PHOS  --  3.7  --     GFR: Estimated Creatinine Clearance: 117.1 mL/min (by C-G formula based on SCr of 0.85 mg/dL).  Liver Function Tests: No results for input(s): "AST", "ALT", "ALKPHOS", "BILITOT", "PROT", "ALBUMIN" in the last 168 hours.  CBG: Recent Labs  Lab 12/27/22 0737 12/27/22 1200 12/27/22 1636 12/27/22 2158 12/28/22 0803  GLUCAP 205* 143* 145* 181* 167*     Recent Results (from the past 240 hour(s))  MRSA Next Gen by PCR, Nasal     Status: None   Collection Time: 12/26/22  9:51 PM   Specimen: Nasal Mucosa; Nasal Swab  Result Value Ref Range Status   MRSA by PCR Next Gen NOT DETECTED NOT DETECTED Final    Comment: (NOTE) The GeneXpert MRSA Assay (FDA approved for NASAL specimens only), is  one component of a comprehensive MRSA colonization surveillance program. It is not intended to diagnose MRSA infection nor to guide or monitor treatment for MRSA infections. Test performance is not FDA approved in patients less than 55 years old. Performed at [facility], [address], [place], [place] [address]          Radiology Studies: ECHOCARDIOGRAM COMPLETE  Result Date: 12/28/2022    ECHOCARDIOGRAM REPORT   Patient Name:   Brittany Giles [facility] Date of Exam: 12/28/2022 Medical Rec #:  [id number]               Height:       69.0 in Accession #:    [id number]              Weight:       303.0 lb Date of Birth:  05/13/1970               BSA:          2.465 m Patient Age:    51 years                BP:           137/93 mmHg Patient Gender: F                       HR:           81 bpm. Exam Location:  Inpatient Procedure: 2D Echo, Cardiac Doppler, Color Doppler and Intracardiac            Opacification Agent Indications:     Pulmonary Embolus I26.09  History:        Patient has no prior history of Echocardiogram examinations.                 Risk Factors:Hypertension, Diabetes and Sleep Apnea.  Sonographer:    Lucendia Herrlich RCS Referring Phys: [phone] [facility]  Sonographer Comments: Patient is obese. IMPRESSIONS  1. Left ventricular ejection fraction, by estimation, is 60 to 65%. The left ventricle has normal function. The left ventricle has no regional wall motion abnormalities. There is severe concentric left ventricular hypertrophy. Left ventricular diastolic  parameters are indeterminate.  2. Right ventricular systolic function is mildly reduced. The right ventricular size is severely enlarged.  3. The mitral valve is normal in structure. No evidence of mitral valve regurgitation. No evidence of mitral stenosis.  4. The aortic valve was not well visualized. Aortic valve regurgitation is not visualized. No aortic stenosis is present.  5. Technically difficult study. FINDINGS  Left Ventricle: Left ventricular ejection fraction, by estimation, is 60 to 65%. The left ventricle has normal function. The left ventricle has no regional wall motion abnormalities. Definity contrast agent was given IV to delineate the left ventricular  endocardial borders. The left ventricular internal cavity size was small. There is severe concentric left ventricular hypertrophy. Left ventricular diastolic parameters are indeterminate. Right Ventricle: The right ventricular size is severely enlarged. Right vetricular wall thickness was not well visualized. Right ventricular systolic function is mildly reduced. Left Atrium: Left atrial size was normal in size. Right Atrium: Right atrial size was normal in size. Pericardium: Trivial pericardial effusion is present. The pericardial effusion is anterior to the right ventricle. Mitral Valve: The mitral valve is normal in structure. No evidence of mitral valve regurgitation. No evidence of mitral valve  stenosis. Tricuspid Valve: The tricuspid valve is not well visualized. Tricuspid valve regurgitation is not  demonstrated. Aortic Valve: The aortic valve was not well visualized. Aortic valve regurgitation is not visualized. No aortic stenosis is present. Aortic valve peak gradient measures 3.2 mmHg. Pulmonic Valve: The pulmonic valve was not well visualized. Pulmonic valve regurgitation is not visualized. No evidence of pulmonic stenosis. Aorta: The aortic root and ascending aorta are structurally normal, with no evidence of dilitation. IAS/Shunts: The interatrial septum was not well visualized.  LEFT VENTRICLE PLAX 2D LVIDd:         3.90 cm LVIDs:         2.30 cm LV PW:         1.30 cm LV IVS:        1.30 cm LVOT diam:     2.10 cm LV SV:         38 LV SV Index:   15 LVOT Area:     3.46 cm  RIGHT VENTRICLE RV S prime:     18.30 cm/s LEFT ATRIUM           Index       RIGHT ATRIUM           Index LA diam:      2.90 cm 1.18 cm/m  RA Area:     12.10 cm LA Vol (A4C): 21.0 ml 8.52 ml/m  RA Volume:   31.10 ml  12.62 ml/m  AORTIC VALVE                 PULMONIC VALVE AV Area (Vmax): 2.70 cm     PR End Diast Vel: 9.49 msec AV Vmax:        89.60 cm/s AV Peak Grad:   3.2 mmHg LVOT Vmax:      69.97 cm/s LVOT Vmean:     45.000 cm/s LVOT VTI:       0.110 m  AORTA Ao Root diam: 3.20 cm Ao Asc diam:  3.20 cm TRICUSPID VALVE TR Peak grad:   10.5 mmHg TR Vmax:        162.00 cm/s  SHUNTS Systemic VTI:  0.11 m Systemic Diam: 2.10 cm Riley Lam MD Electronically signed by Riley Lam MD Signature Date/Time: 12/28/2022/9:52:43 AM    Final    VAS [place] LOWER EXTREMITY VENOUS (DVT)  Result Date: 12/27/2022  Lower Venous DVT Study Patient Name:  Brittany Giles [facility]  Date of Exam:   12/27/2022 Medical Rec #: [id number]                Accession #:    [id number] Date of Birth: 13-Mar-1971                Patient Gender: F Patient Age:   52 years Exam Location:  [facility] Procedure:      VAS [place] LOWER  EXTREMITY VENOUS (DVT) Referring Phys: Eston Esters PATEL --------------------------------------------------------------------------------  Indications: Pulmonary embolism.  Risk Factors: Confirmed PE. Anticoagulation: Heparin. Limitations: Poor ultrasound/tissue interface. Comparison Study: No prior studies. Performing Technologist: Chanda Busing RVT  Examination Guidelines: A complete evaluation includes B-mode imaging, spectral Doppler, color Doppler, and power Doppler as needed of all accessible portions of each vessel. Bilateral testing is considered an integral part of a complete examination. Limited examinations for reoccurring indications may be performed as noted. The reflux portion of the exam is performed with the patient in reverse Trendelenburg.  +---------+---------------+---------+-----------+----------+--------------+ RIGHT    CompressibilityPhasicitySpontaneityPropertiesThrombus Aging +---------+---------------+---------+-----------+----------+--------------+ CFV      Full           Yes  Yes                                 +---------+---------------+---------+-----------+----------+--------------+ SFJ      Full                                                        +---------+---------------+---------+-----------+----------+--------------+ FV Prox  Full                                                        +---------+---------------+---------+-----------+----------+--------------+ FV Mid   Full                                                        +---------+---------------+---------+-----------+----------+--------------+ FV DistalFull                                                        +---------+---------------+---------+-----------+----------+--------------+ PFV      Full                                                        +---------+---------------+---------+-----------+----------+--------------+ POP      Full           Yes      Yes                                  +---------+---------------+---------+-----------+----------+--------------+ PTV      Full                                                        +---------+---------------+---------+-----------+----------+--------------+ PERO     Full                                                        +---------+---------------+---------+-----------+----------+--------------+   +---------+---------------+---------+-----------+----------+--------------+ LEFT     CompressibilityPhasicitySpontaneityPropertiesThrombus Aging +---------+---------------+---------+-----------+----------+--------------+ CFV      Full           Yes      Yes                                 +---------+---------------+---------+-----------+----------+--------------+ SFJ      Full                                                        +---------+---------------+---------+-----------+----------+--------------+  FV Prox  Full                                                        +---------+---------------+---------+-----------+----------+--------------+ FV Mid   Full                                                        +---------+---------------+---------+-----------+----------+--------------+ FV DistalFull                                                        +---------+---------------+---------+-----------+----------+--------------+ PFV      Full                                                        +---------+---------------+---------+-----------+----------+--------------+ POP      Full           Yes      Yes                                 +---------+---------------+---------+-----------+----------+--------------+ PTV      Full                                                        +---------+---------------+---------+-----------+----------+--------------+ PERO     Full                                                         +---------+---------------+---------+-----------+----------+--------------+     Summary: RIGHT: - There is no evidence of deep vein thrombosis in the lower extremity.  - No cystic structure found in the popliteal fossa.  LEFT: - There is no evidence of deep vein thrombosis in the lower extremity.  - No cystic structure found in the popliteal fossa.  *See table(s) above for measurements and observations. Electronically signed by Gerarda Fraction on 12/27/2022 at 10:57:18 AM.    Final    CT HEAD WO CONTRAST ( )  Result Date: 12/27/2022 CLINICAL DATA:  Headache EXAM: CT HEAD WITHOUT CONTRAST TECHNIQUE: Contiguous axial images were obtained from the base of the skull through the vertex without intravenous contrast. RADIATION DOSE REDUCTION: This exam was performed according to the departmental dose-optimization program which includes automated exposure control, adjustment of the mA and/or kV according to patient size and/or use of iterative reconstruction technique. COMPARISON:  None Available. FINDINGS: Brain: No mass,hemorrhage or extra-axial collection. Normal appearance of the parenchyma and CSF spaces. Vascular: No hyperdense vessel or unexpected vascular  calcification. Skull: The visualized skull base, calvarium and extracranial soft tissues are normal. Sinuses/Orbits: No fluid levels or advanced mucosal thickening of the visualized paranasal sinuses. No mastoid or middle ear effusion. Normal orbits. IMPRESSION: Normal head CT. Electronically Signed   By: Deatra Robinson M.D.   On: 12/27/2022 03:03   CT Angio Chest PE W and/or Wo Contrast  Result Date: 12/26/2022 CLINICAL DATA:  Shortness of breath and chest pain. Hyperglycemia. Concern for pulmonary embolism. EXAM: CT ANGIOGRAPHY CHEST WITH CONTRAST TECHNIQUE: Multidetector CT imaging of the chest was performed using the standard protocol during bolus administration of intravenous contrast. Multiplanar CT image reconstructions and MIPs were obtained to  evaluate the vascular anatomy. RADIATION DOSE REDUCTION: This exam was performed according to the departmental dose-optimization program which includes automated exposure control, adjustment of the mA and/or kV according to patient size and/or use of iterative reconstruction technique. CONTRAST:  75mL OMNIPAQUE IOHEXOL 350 MG/ML SOLN COMPARISON:  Chest radiograph dated 12/26/2022. FINDINGS: Evaluation is limited due to streak artifact caused by patient's arms. Cardiovascular: Top-normal cardiac size. No pericardial effusion. The thoracic aorta is unremarkable. Bilateral pulmonary artery emboli involving the lobar branches of the upper and lower lobes. There is right heart straining with RV/LV ratio in the range of 1.1-1.3. Mediastinum/Nodes: No hilar or mediastinal adenopathy. The esophagus is grossly unremarkable. No mediastinal fluid collection. Lungs/Pleura: There is an 8 mm nodule in the left upper lobe versus an area of developing infarct. No consolidative changes. There is no pleural effusion or pneumothorax. The central airways are patent. Upper Abdomen: Fatty liver.  Cholecystectomy. Musculoskeletal: Degenerative changes of the spine. No acute osseous pathology. Review of the MIP images confirms the above findings. IMPRESSION: 1. Positive for acute PE with CT evidence of right heart strain (RV/LV Ratio = 1.2) consistent with at least submassive (intermediate risk) PE. The presence of right heart strain has been associated with an increased risk of morbidity and mortality. Please refer to the "Code PE Focused" order set in EPIC. 2. An 8 mm nodule in the left upper lobe versus an area of developing infarct. Non-contrast chest CT at 6-12 months is recommended. If the nodule is stable at time of repeat CT, then future CT at 18-24 months (from today's scan) is considered optional for low-risk patients, but is recommended for high-risk patients. This recommendation follows the consensus statement: Guidelines for  Management of Incidental Pulmonary Nodules Detected on CT Images: From the Fleischner Society 2017; Radiology 2017; 284:228-243. 3. Fatty liver. These results were called by telephone at the time of interpretation on 12/26/2022 at 6:10 pm to provider [facility] [email or web address] , who verbally acknowledged these results. Electronically Signed   By: Elgie Collard M.D.   On: 12/26/2022 18:13   DG Chest 2 View  Result Date: 12/26/2022 CLINICAL DATA:  Chest pain, shortness of breath EXAM: CHEST - 2 VIEW COMPARISON:  None Available. FINDINGS: The heart size and mediastinal contours are within normal limits. Both lungs are clear. The visualized skeletal structures are unremarkable. IMPRESSION: No acute abnormality of the lungs. Electronically Signed   By: Jearld Lesch M.D.   On: 12/26/2022 17:17        Scheduled Meds:  Chlorhexidine Gluconate Cloth  6 each Topical Daily   citalopram  20 mg Oral Daily   insulin aspart  0-5 Units Subcutaneous QHS   insulin aspart  0-9 Units Subcutaneous TID WC   insulin glargine-yfgn  20 Units Subcutaneous Daily   sodium chloride flush  3 mL Intravenous  Q12H   Continuous Infusions:  heparin 1,400 Units/hr (12/28/22 0849)     LOS: 2 days    Time spent: 45 minutes    Ramiro Harvest, MD [facility]   To contact the attending provider between 7A-7P or the covering provider during after hours 7P-7A, please log into the web site [email or web address] and access using universal [place] password for that web site. If you do not have the password, please call the hospital operator.  12/28/2022, 10:28 AM

## 2022-12-28 NOTE — Progress Notes (Signed)
12/28/2022 Stable night; would walk around unit and do home O2 eval.  Recommend indefinite NoAC in absence of contraindications.  Have arranged for f/u in our office for re-establishing CPAP treatment and f/u of PE.  Available PRN  Myrla Halsted MD PCCM

## 2022-12-28 NOTE — Progress Notes (Signed)
PHARMACY - ANTICOAGULATION CONSULT NOTE  Pharmacy Consult for apixaban Indication: pulmonary embolus  No Known Allergies  Patient Measurements: Height: 5\' 9"  (175.3 cm) Weight: (!) 137.4 kg (303 lb) IBW/kg (Calculated) : 66.2 Heparin Dosing Weight: 99.2 Kg  Vital Signs: Temp: 98 F (36.7 C) (10/15 0750) Temp Source: Axillary (10/15 0750) BP: 138/100 (10/15 1000) Pulse Rate: 107 (10/15 1000)  Labs: Recent Labs    12/26/22 1644 12/26/22 1812 12/26/22 1846 12/26/22 2142 12/26/22 2337 12/27/22 0238 12/27/22 0601 12/27/22 1256 12/27/22 1927 12/28/22 0803  HGB 12.4  --   --   --  12.5  --   --   --  12.7 12.8  HCT 39.5  --   --   --  39.4  --   --   --  40.4 41.0  PLT 208  --   --   --  244  --   --   --  232 239  APTT  --   --  25  --   --   --   --   --   --   --   LABPROT  --   --  13.9  --  14.0  --   --   --   --   --   INR  --   --  1.1  --  1.1  --   --   --   --   --   HEPARINUNFRC  --   --   --   --   --    < >  --  0.62 0.71* 0.67  CREATININE 0.93  --   --   --  0.86  --   --   --   --  0.85  TROPONINIHS 5   < >  --  72* 75*  --  39*  --   --   --    < > = values in this interval not displayed.    Estimated Creatinine Clearance: 117.1 mL/min (by C-G formula based on SCr of 0.85 mg/dL).   Medical History: Past Medical History:  Diagnosis Date   Anxiety    Depression    Diabetes mellitus without complication (HCC)    type 2   Hypertension    OSA on CPAP     Medications:  Scheduled:   apixaban  10 mg Oral BID   Followed by   Melene Muller ON 01/04/2023] apixaban  5 mg Oral BID   Chlorhexidine Gluconate Cloth  6 each Topical Daily   citalopram  20 mg Oral Daily   insulin aspart  0-5 Units Subcutaneous QHS   insulin aspart  0-9 Units Subcutaneous TID WC   insulin glargine-yfgn  20 Units Subcutaneous Daily   sodium chloride flush  3 mL Intravenous Q12H   Assessment: 52 YO female presenting 10/13 with shortness of breath and chest pain for several days.  CTA PE positive for submassive PE and right heart strain. Not on anticoagulation prior to admission. Pharmacy consulted for apixaban dosing.  Today, 12/28/22 CBC: Hgb/plts WNL No s/sx of bleeding or infusion related concerns reported by RN  Goal of Therapy:  Monitor platelets by anticoagulation protocol: Yes   Plan:  Discontinue IV heparin  Initiate 10 mg apixaban BID x 7 days, then 5 mg BID  Monitor s/sx of bleeding daily Will educate patient and prive 30 day free + $10 copay card   Karlton Lemon, PharmD Candidate  10/15/202411:06 AM

## 2022-12-29 ENCOUNTER — Other Ambulatory Visit (HOSPITAL_COMMUNITY): Payer: Self-pay

## 2022-12-29 DIAGNOSIS — E1159 Type 2 diabetes mellitus with other circulatory complications: Secondary | ICD-10-CM | POA: Diagnosis not present

## 2022-12-29 DIAGNOSIS — J9601 Acute respiratory failure with hypoxia: Secondary | ICD-10-CM | POA: Diagnosis not present

## 2022-12-29 DIAGNOSIS — I2699 Other pulmonary embolism without acute cor pulmonale: Secondary | ICD-10-CM | POA: Diagnosis not present

## 2022-12-29 DIAGNOSIS — E876 Hypokalemia: Secondary | ICD-10-CM | POA: Diagnosis not present

## 2022-12-29 LAB — BASIC METABOLIC PANEL
Anion gap: 11 (ref 5–15)
BUN: 22 mg/dL — ABNORMAL HIGH (ref 6–20)
CO2: 27 mmol/L (ref 22–32)
Calcium: 9.5 mg/dL (ref 8.9–10.3)
Chloride: 100 mmol/L (ref 98–111)
Creatinine, Ser: 0.93 mg/dL (ref 0.44–1.00)
GFR, Estimated: >60 mL/min (ref >60)
Glucose, Bld: 205 mg/dL — ABNORMAL HIGH (ref 70–99)
Potassium: 4 mmol/L (ref 3.5–5.1)
Sodium: 138 mmol/L (ref 135–145)

## 2022-12-29 LAB — GLUCOSE, CAPILLARY
Glucose-Capillary: 192 mg/dL — ABNORMAL HIGH (ref 70–99)
Glucose-Capillary: 183 mg/dL — ABNORMAL HIGH (ref 70–99)

## 2022-12-29 MED ORDER — ACCU-CHEK SOFTCLIX LANCETS MISC
1.0000 | Freq: Three times a day (TID) | 0 refills | Status: AC
  Filled 2022-12-29: qty 100, 34d supply, fill #0

## 2022-12-29 MED ORDER — ACCU-CHEK SOFTCLIX LANCETS MISC
1.0000 | Freq: Three times a day (TID) | 0 refills | Status: AC
Start: 2022-12-29 — End: 2023-01-28
  Filled 2022-12-29: qty 100, 30d supply, fill #0

## 2022-12-29 MED ORDER — LISINOPRIL 20 MG PO TABS
20.0000 mg | ORAL_TABLET | Freq: Every day | ORAL | 0 refills | Status: AC
  Filled 2022-12-29: qty 30, 30d supply, fill #0

## 2022-12-29 MED ORDER — PEN NEEDLES 31G X 5 MM MISC
1.0000 | Freq: Three times a day (TID) | 0 refills | Status: AC
  Filled 2022-12-29: qty 100, 34d supply, fill #0

## 2022-12-29 MED ORDER — LANCET DEVICE MISC
1.0000 | Freq: Three times a day (TID) | 0 refills | Status: AC
  Filled 2022-12-29: qty 1, fill #0

## 2022-12-29 MED ORDER — SALINE SPRAY 0.65 % NA SOLN
1.0000 | NASAL | 0 refills | Status: DC | PRN
  Filled 2022-12-29: qty 44, 30d supply, fill #0

## 2022-12-29 MED ORDER — BLOOD GLUCOSE TEST VI STRP
1.0000 | ORAL_STRIP | Freq: Three times a day (TID) | 0 refills | Status: AC
Start: 2022-12-29 — End: 2023-02-01
  Filled 2022-12-29: qty 100, 34d supply, fill #0

## 2022-12-29 MED ORDER — LANCET DEVICE MISC
1.0000 | Freq: Three times a day (TID) | 0 refills | Status: AC
  Filled 2022-12-29: qty 1, 30d supply, fill #0

## 2022-12-29 MED ORDER — BLOOD GLUCOSE TEST VI STRP
1.0000 | ORAL_STRIP | Freq: Three times a day (TID) | 0 refills | Status: AC
  Filled 2022-12-29: qty 100, 34d supply, fill #0

## 2022-12-29 MED ORDER — METFORMIN HCL 500 MG PO TABS
500.0000 mg | ORAL_TABLET | Freq: Two times a day (BID) | ORAL | 0 refills | Status: DC
  Filled 2022-12-29: qty 60, 30d supply, fill #0

## 2022-12-29 MED ORDER — ACETAMINOPHEN 500 MG PO TABS
1000.0000 mg | ORAL_TABLET | Freq: Four times a day (QID) | ORAL | 0 refills | Status: AC | PRN
  Filled 2022-12-29: qty 30, 4d supply, fill #0

## 2022-12-29 MED ORDER — SEMAGLUTIDE-WEIGHT MANAGEMENT 0.5 MG/0.5ML ~~LOC~~ SOAJ
0.5000 mg | SUBCUTANEOUS | 0 refills | Status: AC
  Filled 2022-12-29: qty 2, 28d supply, fill #0

## 2022-12-29 MED ORDER — BLOOD GLUCOSE MONITOR SYSTEM W/DEVICE KIT
1.0000 | PACK | Freq: Three times a day (TID) | 0 refills | Status: AC
  Filled 2022-12-29: qty 1, 30d supply, fill #0

## 2022-12-29 MED ORDER — BLOOD GLUCOSE MONITORING SUPPL DEVI
1.0000 | Freq: Three times a day (TID) | 0 refills | Status: AC
  Filled 2022-12-29: qty 1, fill #0

## 2022-12-29 MED ORDER — ONDANSETRON HCL 4 MG PO TABS
4.0000 mg | ORAL_TABLET | Freq: Four times a day (QID) | ORAL | 0 refills | Status: DC | PRN
  Filled 2022-12-29: qty 18, 21d supply, fill #0

## 2022-12-29 MED ORDER — INSULIN GLARGINE 100 UNIT/ML SOLOSTAR PEN
20.0000 [IU] | PEN_INJECTOR | Freq: Every day | SUBCUTANEOUS | 0 refills | Status: AC
  Filled 2022-12-29: qty 15, 75d supply, fill #0

## 2022-12-29 MED ORDER — PEN NEEDLES 32G X 5 MM MISC
1.0000 | Freq: Every day | 0 refills | Status: AC
  Filled 2022-12-29: qty 100, 34d supply, fill #0

## 2022-12-29 NOTE — Progress Notes (Signed)
SATURATION QUALIFICATIONS: (This note is used to comply with regulatory documentation for home oxygen)  Patient Saturations on Room Air at Rest = 97%  Patient Saturations on Room Air while Ambulating = 94%

## 2022-12-29 NOTE — Discharge Summary (Signed)
Physician Discharge Summary   Patient: Brittany Giles MRN: [id number] DOB: May 27, 1970  Admit date:     12/26/2022  Discharge date: 12/29/22  Discharge Physician: Marguerita Merles, DO   PCP: Irena Reichmann, DO   Recommendations at discharge:   Follow up with PCP within 1-2 weeks and repeat CBC,CMP,Mag, Phos within 1 week Follow-up with pulmonary outpatient setting and repeat echo in 3 months Follow-up in outpatient setting and repeat noncontrast CT scan for possible lung nodule versus the pulmonary infarct Follow-up with hematology in outpatient setting for hypercoagulable workup  Discharge Diagnoses: Principal Problem:   Bilateral pulmonary embolism (HCC) Active Problems:   Acute respiratory failure with hypoxia (HCC)   Type 2 diabetes mellitus (HCC)   Hypertension associated with diabetes (HCC)   Hypokalemia   OSA (obstructive sleep apnea)  Resolved Problems:   * No resolved hospital problems. *  Hospital Course: Brittany Giles is a 52 y.o. female with medical history significant for T2DM, HTN, depression/anxiety, OSA on CPAP who is admitted with a history of worsening shortness of breath with minimal exertion as well as lower extremity edema and chest discomfort and pain and was found to have acute bilateral pulmonary emboli.  Upon arrival to the ED she had a syncopal episode and she was assessed and CTA done showed submassive PE with right ventricular heart strain and she was admitted to the stepdown unit placed on IV heparin and PCCM was consulted.  She is transition off of heparin for anticoagulation and pulmonary cleared the patient for discharge.  She ambulated and did not desaturate and she is stable for discharge at this time.  She will follow-up with PCP, hematology outpatient setting as well as pulmonology for follow-up.  Assessment and Plan:  Acute Hypoxic Respiratory Failure secondary to Bilateral PE/submassive PE -Patient presenting with sudden onset  shortness of breath and chest pain x 2 days, noted to have a syncopal episode in the ED. -D-Dimer was 8.36 -Patient with no recent long car rides or travel, no recent surgeries, no prior history of PE. -CT angiogram chest done with acute bilateral PE with CT evidence of right heart strain. -Patient on presentation noted to have required 4 L supplemental O2 and now weaned off -Cardiac enzymes initially elevated but trending back down. Troponin went from 72 -> 39 -Hemodynamically currently stable. -Lower extremity Dopplers negative for DVT.   -ECHOCardiogram done and showed and EF of 60-65% with concentric LVH and mildly reduced RV Fxn  -Orthostatics done this morning and patient currently asymptomatic with no significant orthostasis.   -Ambulatory sats done and she did not desaturate -Transition from IV heparin to Eliquis.  -Patient seen in consultation by PCCM; Per PCCM if recurrence of dizziness or persistent hypoxia may need to reconsider EKOS but she is improved and did not desaturate on Ambulatory Home O2 Screen and is stable for D/C -Per PCCM as this is an unprovoked submassive PE recommendations are to consider lifelong anticoagulation as there is a 40% of recurrence rate with cessation. -Outpatient follow-up with pulmonary.   Uncontrolled Diabetes Mellitus Type 2 -Hemoglobin A1c 10.5. -Patient states she is on Ozempic as well as metformin. -States she ran out of her metformin 3 months ago, has not seen PCP in over a year and as such as to be seen by PCP prior to refills being placed. -CBG  Trend: Recent Labs  Lab 12/27/22 1636 12/27/22 2158 12/28/22 0803 12/28/22 1214 12/28/22 1613 12/28/22 2005 12/29/22 0756  GLUCAP 145* 181*  167* 149* 191* 177* 192*  -Continue Semglee 20 units daily, SSI while hospitalized.  -Diabetes coordinator following. -Will need prescriptions for Ozempic and metformin on discharge likely Ozempic at a lower dose of 0.5 mg daily.   OSA -Patient noted  to be noncompliant with CPAP. -Patient states her CPAP machine is old does not work as well and that is why she is noncompliant however states she is willing to follow-up for repeat sleep study and to be set up for another CPAP machine. -Outpatient follow-up with pulmonary for further evaluation and repeat sleep studies.   Hypokalemia -Patient's K+ Level Trend: Recent Labs  Lab 12/26/22 1644 12/26/22 2337 12/28/22 0803 12/29/22 0458  K 3.4* 3.8 3.8 4.0  -Continue to Monitor and Replete as Necessary -Repeat CMP within 1 week    Hypertension -Not on any meds for almost 3 months but has Lisinopril 20 mg po Daily listed on MAR. -BP currently stable.   -Hydralazine as needed while hospitalized and resume Home Lisinopril    Left upper lobe lung nodule versus developing infarct -CT showed an 8 mm nodule in the left upper lobe versus an area of developing infarct. -Repeat noncontrast CT at 6 to 12 months recommended. -Outpatient follow-up with Pulmonary at D/C.  Morbid Obesity -Complicates overall prognosis and care -Estimated body mass index is 44.75 kg/m as calculated from the following:   Height as of this encounter: 5\' 9"  (1.753 m).   Weight as of this encounter: 137.4 kg.  -Weight Loss and Dietary Counseling given  Consultants: Pulmonary Procedures performed: As delineated as above  Disposition: Home Diet recommendation:  Cardiac and Carb modified diet DISCHARGE MEDICATION: Allergies as of 12/29/2022   No Known Allergies      Medication List     TAKE these medications    Accu-Chek Guide test strip Generic drug: glucose blood Use as directed in the morning, at noon and at bedtime.   BLOOD GLUCOSE TEST STRIPS Strp Use  3 (three) times daily. Use as directed to check blood sugar. May dispense any manufacturer covered by patient's insurance and fits patient's device.   Accu-Chek Guide w/Device Kit Use as directed in the morning, at noon, and at bedtime.   Blood  Glucose Monitoring Suppl Devi Use 3 (three) times daily. May dispense any manufacturer covered by patient's insurance.   Accu-Chek Softclix Lancets lancets Use as directed in the morning, at noon, and at bedtime.   Accu-Chek Softclix Lancets lancets Use as directed to check blood sugar in the morning, at noon, and at bedtime.   Acetaminophen Extra Strength 500 MG Tabs Take 2 tablets (1,000 mg total) by mouth every 6 (six) hours as needed for mild pain (pain score 1-3), fever or headache (or Fever >/= 101).   Apple Cider Vinegar 500 MG Tabs Take 1 tablet by mouth daily.   citalopram 20 MG tablet Commonly known as: CELEXA Take 20 mg by mouth daily.   Deep Sea Nasal Spray 0.65 % nasal spray Generic drug: sodium chloride Place 1 spray into both nostrils as needed for congestion.   Eliquis DVT/PE Starter Pack Generic drug: Apixaban Starter Pack (10mg  and 5mg ) Take as directed on package: start with two-5mg  tablets twice daily for 7 days. On day 8, switch to one-5mg  tablet twice daily.   Lancet Device Misc Use in the morning, at noon, and at bedtime. May substitute to any manufacturer covered by patient's insurance.   Lancet Device Misc Use 3 (three) times daily. May dispense any manufacturer  covered by patient's insurance.   Lantus SoloStar 100 UNIT/ML Solostar Pen Generic drug: insulin glargine Inject 20 Units into the skin daily.   lisinopril 20 MG tablet Commonly known as: ZESTRIL Take 1 tablet (20 mg total) by mouth daily.   metFORMIN 500 MG tablet Commonly known as: GLUCOPHAGE Take 1 tablet (500 mg total) by mouth 2 (two) times daily with a meal.   ondansetron 4 MG tablet Commonly known as: ZOFRAN Take 1 tablet (4 mg total) by mouth every 6 (six) hours as needed for nausea.   Pen Needles 32G X 5 MM Misc Use daily as directed   B-D UF III MINI PEN NEEDLES 31G X 5 MM Misc Generic drug: Insulin Pen Needle Use as directed 3 times daily with insulin.    Semaglutide-Weight Management 0.5 MG/0.5ML Soaj Inject 0.5 mg into the skin once a week.        Follow-up Information     Irena Reichmann, DO. Call.   Specialty: Family Medicine Why: Follow up within 1-2 weeks Contact information: [address] [place] [address] [place] [address] [phone]         [facility]. Call.   Specialty: Pulmonology Why: Follow up for PE and Post-Hospitalization Contact information: [address] [place] [address] [phone]               Discharge Exam: Ceasar Mons Weights   12/26/22 1559  Weight: (!) 137.4 kg   Vitals:   12/29/22 0414 12/29/22 1412  BP: 132/89 125/72  Pulse: 84 93  Resp: 19 18  Temp: (!) 97.5 F (36.4 C) 98.4 F (36.9 C)  SpO2: 99% 95%   Examination: Physical Exam:  Constitutional: WN/WD morbidly obese African-American female in no acute distress Respiratory: Diminished to auscultation bilaterally with some coarse breath sounds, no wheezing, rales, rhonchi or crackles. Normal respiratory effort and patient is not tachypenic. No accessory muscle use.  Unlabored breathing and not wearing supplemental oxygen nasal cannula Cardiovascular: RRR, no murmurs / rubs / gallops. S1 and S2 auscultated. No extremity edema.  Abdomen: Soft, non-tender, distended secondary to body habitus. Bowel sounds positive.  GU: Deferred. Musculoskeletal: No clubbing / cyanosis of digits/nails. No joint deformity upper and lower extremities.  Skin: No rashes, lesions, ulcers on a limited skin evaluation. No induration; Warm and dry.  Neurologic: CN 2-12 grossly intact with no focal deficits. Romberg sign and cerebellar reflexes not assessed.  Psychiatric: Normal judgment and insight. Alert and oriented x 3. Normal mood and appropriate affect.   Condition at discharge: stable  The results of significant diagnostics from this hospitalization (including imaging,  microbiology, ancillary and laboratory) are listed below for reference.   Imaging Studies: ECHOCARDIOGRAM COMPLETE  Result Date: 12/28/2022    ECHOCARDIOGRAM REPORT   Patient Name:   CHANDRA ASHER [facility] Date of Exam: 12/28/2022 Medical Rec #:  [id number]               Height:       69.0 in Accession #:    [id number]              Weight:       303.0 lb Date of Birth:  1970/04/23               BSA:          2.465 m Patient Age:    51 years  BP:           137/93 mmHg Patient Gender: F                       HR:           81 bpm. Exam Location:  Inpatient Procedure: 2D Echo, Cardiac Doppler, Color Doppler and Intracardiac            Opacification Agent Indications:    Pulmonary Embolus I26.09  History:        Patient has no prior history of Echocardiogram examinations.                 Risk Factors:Hypertension, Diabetes and Sleep Apnea.  Sonographer:    Lucendia Herrlich RCS Referring Phys: [phone] [facility]  Sonographer Comments: Patient is obese. IMPRESSIONS  1. Left ventricular ejection fraction, by estimation, is 60 to 65%. The left ventricle has normal function. The left ventricle has no regional wall motion abnormalities. There is severe concentric left ventricular hypertrophy. Left ventricular diastolic  parameters are indeterminate.  2. Right ventricular systolic function is mildly reduced. The right ventricular size is severely enlarged.  3. The mitral valve is normal in structure. No evidence of mitral valve regurgitation. No evidence of mitral stenosis.  4. The aortic valve was not well visualized. Aortic valve regurgitation is not visualized. No aortic stenosis is present.  5. Technically difficult study. FINDINGS  Left Ventricle: Left ventricular ejection fraction, by estimation, is 60 to 65%. The left ventricle has normal function. The left ventricle has no regional wall motion abnormalities. Definity contrast agent was given IV to delineate the left ventricular  endocardial  borders. The left ventricular internal cavity size was small. There is severe concentric left ventricular hypertrophy. Left ventricular diastolic parameters are indeterminate. Right Ventricle: The right ventricular size is severely enlarged. Right vetricular wall thickness was not well visualized. Right ventricular systolic function is mildly reduced. Left Atrium: Left atrial size was normal in size. Right Atrium: Right atrial size was normal in size. Pericardium: Trivial pericardial effusion is present. The pericardial effusion is anterior to the right ventricle. Mitral Valve: The mitral valve is normal in structure. No evidence of mitral valve regurgitation. No evidence of mitral valve stenosis. Tricuspid Valve: The tricuspid valve is not well visualized. Tricuspid valve regurgitation is not demonstrated. Aortic Valve: The aortic valve was not well visualized. Aortic valve regurgitation is not visualized. No aortic stenosis is present. Aortic valve peak gradient measures 3.2 mmHg. Pulmonic Valve: The pulmonic valve was not well visualized. Pulmonic valve regurgitation is not visualized. No evidence of pulmonic stenosis. Aorta: The aortic root and ascending aorta are structurally normal, with no evidence of dilitation. IAS/Shunts: The interatrial septum was not well visualized.  LEFT VENTRICLE PLAX 2D LVIDd:         3.90 cm LVIDs:         2.30 cm LV PW:         1.30 cm LV IVS:        1.30 cm LVOT diam:     2.10 cm LV SV:         38 LV SV Index:   15 LVOT Area:     3.46 cm  RIGHT VENTRICLE RV S prime:     18.30 cm/s LEFT ATRIUM           Index       RIGHT ATRIUM           Index  LA diam:      2.90 cm 1.18 cm/m  RA Area:     12.10 cm LA Vol (A4C): 21.0 ml 8.52 ml/m  RA Volume:   31.10 ml  12.62 ml/m  AORTIC VALVE                 PULMONIC VALVE AV Area (Vmax): 2.70 cm     PR End Diast Vel: 9.49 msec AV Vmax:        89.60 cm/s AV Peak Grad:   3.2 mmHg LVOT Vmax:      69.97 cm/s LVOT Vmean:     45.000 cm/s LVOT  VTI:       0.110 m  AORTA Ao Root diam: 3.20 cm Ao Asc diam:  3.20 cm TRICUSPID VALVE TR Peak grad:   10.5 mmHg TR Vmax:        162.00 cm/s  SHUNTS Systemic VTI:  0.11 m Systemic Diam: 2.10 cm Riley Lam MD Electronically signed by Riley Lam MD Signature Date/Time: 12/28/2022/9:52:43 AM    Final    VAS [place] LOWER EXTREMITY VENOUS (DVT)  Result Date: 12/27/2022  Lower Venous DVT Study Patient Name:  CALENA SALEM [facility]  Date of Exam:   12/27/2022 Medical Rec #: [id number]                Accession #:    [id number] Date of Birth: 1970/06/09                Patient Gender: F Patient Age:   26 years Exam Location:  [facility] Procedure:      VAS [place] LOWER EXTREMITY VENOUS (DVT) Referring Phys: Eston Esters PATEL --------------------------------------------------------------------------------  Indications: Pulmonary embolism.  Risk Factors: Confirmed PE. Anticoagulation: Heparin. Limitations: Poor ultrasound/tissue interface. Comparison Study: No prior studies. Performing Technologist: Chanda Busing RVT  Examination Guidelines: A complete evaluation includes B-mode imaging, spectral Doppler, color Doppler, and power Doppler as needed of all accessible portions of each vessel. Bilateral testing is considered an integral part of a complete examination. Limited examinations for reoccurring indications may be performed as noted. The reflux portion of the exam is performed with the patient in reverse Trendelenburg.  +---------+---------------+---------+-----------+----------+--------------+ RIGHT    CompressibilityPhasicitySpontaneityPropertiesThrombus Aging +---------+---------------+---------+-----------+----------+--------------+ CFV      Full           Yes      Yes                                 +---------+---------------+---------+-----------+----------+--------------+ SFJ      Full                                                         +---------+---------------+---------+-----------+----------+--------------+ FV Prox  Full                                                        +---------+---------------+---------+-----------+----------+--------------+ FV Mid   Full                                                        +---------+---------------+---------+-----------+----------+--------------+  FV DistalFull                                                        +---------+---------------+---------+-----------+----------+--------------+ PFV      Full                                                        +---------+---------------+---------+-----------+----------+--------------+ POP      Full           Yes      Yes                                 +---------+---------------+---------+-----------+----------+--------------+ PTV      Full                                                        +---------+---------------+---------+-----------+----------+--------------+ PERO     Full                                                        +---------+---------------+---------+-----------+----------+--------------+   +---------+---------------+---------+-----------+----------+--------------+ LEFT     CompressibilityPhasicitySpontaneityPropertiesThrombus Aging +---------+---------------+---------+-----------+----------+--------------+ CFV      Full           Yes      Yes                                 +---------+---------------+---------+-----------+----------+--------------+ SFJ      Full                                                        +---------+---------------+---------+-----------+----------+--------------+ FV Prox  Full                                                        +---------+---------------+---------+-----------+----------+--------------+ FV Mid   Full                                                         +---------+---------------+---------+-----------+----------+--------------+ FV DistalFull                                                        +---------+---------------+---------+-----------+----------+--------------+  PFV      Full                                                        +---------+---------------+---------+-----------+----------+--------------+ POP      Full           Yes      Yes                                 +---------+---------------+---------+-----------+----------+--------------+ PTV      Full                                                        +---------+---------------+---------+-----------+----------+--------------+ PERO     Full                                                        +---------+---------------+---------+-----------+----------+--------------+     Summary: RIGHT: - There is no evidence of deep vein thrombosis in the lower extremity.  - No cystic structure found in the popliteal fossa.  LEFT: - There is no evidence of deep vein thrombosis in the lower extremity.  - No cystic structure found in the popliteal fossa.  *See table(s) above for measurements and observations. Electronically signed by Gerarda Fraction on 12/27/2022 at 10:57:18 AM.    Final    CT HEAD WO CONTRAST ( )  Result Date: 12/27/2022 CLINICAL DATA:  Headache EXAM: CT HEAD WITHOUT CONTRAST TECHNIQUE: Contiguous axial images were obtained from the base of the skull through the vertex without intravenous contrast. RADIATION DOSE REDUCTION: This exam was performed according to the departmental dose-optimization program which includes automated exposure control, adjustment of the mA and/or kV according to patient size and/or use of iterative reconstruction technique. COMPARISON:  None Available. FINDINGS: Brain: No mass,hemorrhage or extra-axial collection. Normal appearance of the parenchyma and CSF spaces. Vascular: No hyperdense vessel or unexpected vascular  calcification. Skull: The visualized skull base, calvarium and extracranial soft tissues are normal. Sinuses/Orbits: No fluid levels or advanced mucosal thickening of the visualized paranasal sinuses. No mastoid or middle ear effusion. Normal orbits. IMPRESSION: Normal head CT. Electronically Signed   By: Deatra Robinson M.D.   On: 12/27/2022 03:03   CT Angio Chest PE W and/or Wo Contrast  Result Date: 12/26/2022 CLINICAL DATA:  Shortness of breath and chest pain. Hyperglycemia. Concern for pulmonary embolism. EXAM: CT ANGIOGRAPHY CHEST WITH CONTRAST TECHNIQUE: Multidetector CT imaging of the chest was performed using the standard protocol during bolus administration of intravenous contrast. Multiplanar CT image reconstructions and MIPs were obtained to evaluate the vascular anatomy. RADIATION DOSE REDUCTION: This exam was performed according to the departmental dose-optimization program which includes automated exposure control, adjustment of the mA and/or kV according to patient size and/or use of iterative reconstruction technique. CONTRAST:  75mL OMNIPAQUE IOHEXOL 350 MG/ML SOLN COMPARISON:  Chest radiograph dated 12/26/2022. FINDINGS: Evaluation is limited due to streak artifact caused by patient's arms. Cardiovascular:  Top-normal cardiac size. No pericardial effusion. The thoracic aorta is unremarkable. Bilateral pulmonary artery emboli involving the lobar branches of the upper and lower lobes. There is right heart straining with RV/LV ratio in the range of 1.1-1.3. Mediastinum/Nodes: No hilar or mediastinal adenopathy. The esophagus is grossly unremarkable. No mediastinal fluid collection. Lungs/Pleura: There is an 8 mm nodule in the left upper lobe versus an area of developing infarct. No consolidative changes. There is no pleural effusion or pneumothorax. The central airways are patent. Upper Abdomen: Fatty liver.  Cholecystectomy. Musculoskeletal: Degenerative changes of the spine. No acute osseous  pathology. Review of the MIP images confirms the above findings. IMPRESSION: 1. Positive for acute PE with CT evidence of right heart strain (RV/LV Ratio = 1.2) consistent with at least submassive (intermediate risk) PE. The presence of right heart strain has been associated with an increased risk of morbidity and mortality. Please refer to the "Code PE Focused" order set in EPIC. 2. An 8 mm nodule in the left upper lobe versus an area of developing infarct. Non-contrast chest CT at 6-12 months is recommended. If the nodule is stable at time of repeat CT, then future CT at 18-24 months (from today's scan) is considered optional for low-risk patients, but is recommended for high-risk patients. This recommendation follows the consensus statement: Guidelines for Management of Incidental Pulmonary Nodules Detected on CT Images: From the Fleischner Society 2017; Radiology 2017; 284:228-243. 3. Fatty liver. These results were called by telephone at the time of interpretation on 12/26/2022 at 6:10 pm to provider [facility] [email or web address] , who verbally acknowledged these results. Electronically Signed   By: Elgie Collard M.D.   On: 12/26/2022 18:13   DG Chest 2 View  Result Date: 12/26/2022 CLINICAL DATA:  Chest pain, shortness of breath EXAM: CHEST - 2 VIEW COMPARISON:  None Available. FINDINGS: The heart size and mediastinal contours are within normal limits. Both lungs are clear. The visualized skeletal structures are unremarkable. IMPRESSION: No acute abnormality of the lungs. Electronically Signed   By: Jearld Lesch M.D.   On: 12/26/2022 17:17    Microbiology: Results for orders placed or performed during the hospital encounter of 12/26/22  MRSA Next Gen by PCR, Nasal     Status: None   Collection Time: 12/26/22  9:51 PM   Specimen: Nasal Mucosa; Nasal Swab  Result Value Ref Range Status   MRSA by PCR Next Gen NOT DETECTED NOT DETECTED Final    Comment: (NOTE) The GeneXpert MRSA Assay (FDA approved for NASAL  specimens only), is one component of a comprehensive MRSA colonization surveillance program. It is not intended to diagnose MRSA infection nor to guide or monitor treatment for MRSA infections. Test performance is not FDA approved in patients less than 68 years old. Performed at [facility], [address], [place], [place] [address]    Labs: CBC: Recent Labs  Lab 12/26/22 1644 12/26/22 2337 12/27/22 1927 12/28/22 0803  WBC 9.0 7.9 5.1 5.2  HGB 12.4 12.5 12.7 12.8  HCT 39.5 39.4 40.4 41.0  MCV 89.4 88.1 88.4 89.1  PLT 208 244 232 239   Basic Metabolic Panel: Recent Labs  Lab 12/26/22 1644 12/26/22 2337 12/28/22 0803 12/29/22 0458  NA 136 135 136 138  K 3.4* 3.8 3.8 4.0  CL 100 103 102 100  CO2 25 24 24 27   GLUCOSE 227* 178* 188* 205*  BUN 12 12 17  22*  CREATININE 0.93 0.86 0.85 0.93  CALCIUM 9.5 9.3 9.3 9.5  MG  --  1.8  --   --   PHOS  --  3.7  --   --    Liver Function Tests: No results for input(s): "AST", "ALT", "ALKPHOS", "BILITOT", "PROT", "ALBUMIN" in the last 168 hours. CBG: Recent Labs  Lab 12/28/22 1214 12/28/22 1613 12/28/22 2005 12/29/22 0756 12/29/22 1148  GLUCAP 149* 191* 177* 192* 183*   Discharge time spent: greater than 30 minutes.  Signed: Marguerita Merles, DO [facility] 12/30/2022

## 2022-12-29 NOTE — Inpatient Diabetes Management (Signed)
Inpatient Diabetes Program Recommendations  AACE/ADA: New Consensus Statement on Inpatient Glycemic Control (2015)  Target Ranges:  Prepandial:   less than 140 mg/dL      Peak postprandial:   less than 180 mg/dL (1-2 hours)      Critically ill patients:  140 - 180 mg/dL   Lab Results  Component Value Date   GLUCAP 183 (H) 12/29/2022   HGBA1C 10.5 (H) 12/26/2022    Review of Glycemic Control  Spoke with Brittany Giles at bedside regarding her HgbA1C of 10.5%. Long discussion regarding adding basal insulin (1 shot/day) to assist with glucose control. Brittany Giles willing to try Lantus at home and will f/u with PCP. Instructed to check blood sugars at least 3x/day and take logbook to PCP for review. Discussed hypoglycemia s/s and treatment. Discussed glucose and A1C goals. Discussed importance of checking CBGs and maintaining good CBG control to prevent long-term and short-term complications. Explained how hyperglycemia leads to damage within blood vessels which lead to the common complications seen with uncontrolled diabetes. Stressed to the patient the importance of improving glycemic control to prevent further complications from uncontrolled diabetes. Discussed impact of nutrition, exercise, stress, sickness, and medications on diabetes control. Brittany Giles verbalized understanding and reports no further questions related to her diabetes.     Inpatient Diabetes Program Recommendations:    Discharge Recommendations: Other recommendations: metformin 500 BID, Ozempic 0.5 weekly Long acting recommendations: Insulin Glargine (LANTUS) Solostar Pen 20 units daily  Supply/Referral recommendations: Pen needles - standard   Use Adult Diabetes Insulin Treatment Post Discharge order set.    Thank you. Ailene Ards, RD, LDN, CDCES Inpatient Diabetes Coordinator [phone]

## 2022-12-30 ENCOUNTER — Other Ambulatory Visit (HOSPITAL_COMMUNITY): Payer: Self-pay

## 2023-01-06 ENCOUNTER — Other Ambulatory Visit: Payer: Self-pay

## 2023-01-06 ENCOUNTER — Inpatient Hospital Stay: Payer: BC Managed Care – PPO | Attending: Hematology | Admitting: Hematology

## 2023-01-06 ENCOUNTER — Inpatient Hospital Stay: Payer: BC Managed Care – PPO

## 2023-01-06 VITALS — BP 115/80 | HR 83 | Temp 98.6°F | Resp 18 | Wt 283.0 lb

## 2023-01-06 DIAGNOSIS — Z7985 Long-term (current) use of injectable non-insulin antidiabetic drugs: Secondary | ICD-10-CM | POA: Diagnosis not present

## 2023-01-06 DIAGNOSIS — Z7901 Long term (current) use of anticoagulants: Secondary | ICD-10-CM | POA: Diagnosis not present

## 2023-01-06 DIAGNOSIS — I2699 Other pulmonary embolism without acute cor pulmonale: Secondary | ICD-10-CM | POA: Diagnosis not present

## 2023-01-06 DIAGNOSIS — I10 Essential (primary) hypertension: Secondary | ICD-10-CM | POA: Insufficient documentation

## 2023-01-06 DIAGNOSIS — E1165 Type 2 diabetes mellitus with hyperglycemia: Secondary | ICD-10-CM | POA: Diagnosis not present

## 2023-01-06 LAB — CBC WITH DIFFERENTIAL (CANCER CENTER ONLY)
Abs Immature Granulocytes: 0.03 10*3/uL (ref 0.00–0.07)
Basophils Absolute: 0 10*3/uL (ref 0.0–0.1)
Basophils Relative: 1 %
Eosinophils Absolute: 0.1 10*3/uL (ref 0.0–0.5)
Eosinophils Relative: 2 %
HCT: 36.1 % (ref 36.0–46.0)
Hemoglobin: 11.6 g/dL — ABNORMAL LOW (ref 12.0–15.0)
Immature Granulocytes: 1 %
Lymphocytes Relative: 28 %
Lymphs Abs: 1.6 10*3/uL (ref 0.7–4.0)
MCH: 27.8 pg (ref 26.0–34.0)
MCHC: 32.1 g/dL (ref 30.0–36.0)
MCV: 86.6 fL (ref 80.0–100.0)
Monocytes Absolute: 0.5 10*3/uL (ref 0.1–1.0)
Monocytes Relative: 9 %
Neutro Abs: 3.4 10*3/uL (ref 1.7–7.7)
Neutrophils Relative %: 59 %
Platelet Count: 333 10*3/uL (ref 150–400)
RBC: 4.17 MIL/uL (ref 3.87–5.11)
RDW: 15.1 % (ref 11.5–15.5)
WBC Count: 5.6 10*3/uL (ref 4.0–10.5)
nRBC: 0 % (ref 0.0–0.2)

## 2023-01-06 LAB — CMP (CANCER CENTER ONLY)
ALT: 29 U/L (ref 0–44)
AST: 25 U/L (ref 15–41)
Albumin: 3.8 g/dL (ref 3.5–5.0)
Alkaline Phosphatase: 65 U/L (ref 38–126)
Anion gap: 9 (ref 5–15)
BUN: 17 mg/dL (ref 6–20)
CO2: 26 mmol/L (ref 22–32)
Calcium: 9.3 mg/dL (ref 8.9–10.3)
Chloride: 104 mmol/L (ref 98–111)
Creatinine: 0.97 mg/dL (ref 0.44–1.00)
GFR, Estimated: >60 mL/min (ref >60)
Glucose, Bld: 135 mg/dL — ABNORMAL HIGH (ref 70–99)
Potassium: 3.8 mmol/L (ref 3.5–5.1)
Sodium: 139 mmol/L (ref 135–145)
Total Bilirubin: 0.5 mg/dL (ref 0.3–1.2)
Total Protein: 7.9 g/dL (ref 6.5–8.1)

## 2023-01-06 LAB — ANTITHROMBIN III: AntiThromb III Func: 103 % (ref 75–120)

## 2023-01-06 MED ORDER — APIXABAN 5 MG PO TABS
5.0000 mg | ORAL_TABLET | Freq: Two times a day (BID) | ORAL | 4 refills | Status: DC
Start: 2023-01-06 — End: 2023-06-29

## 2023-01-06 NOTE — Progress Notes (Signed)
HEMATOLOGY/ONCOLOGY CONSULTATION NOTE  Date of Service: 01/06/2023  Patient Care Team: Irena Reichmann, DO as PCP - General (Family Medicine)  CHIEF COMPLAINTS/PURPOSE OF CONSULTATION:  Evaluation and management of bilateral pulmonary embolism.  HISTORY OF PRESENTING ILLNESS:   Brittany Giles is a wonderful 52 y.o. female who has been referred to [place] by Irena Reichmann, DO for evaluation and management of bilateral pulmonary embolism.  She presented to the ED on 12/26/2022 for bilateral pulmonary embolism. CTA chest showed acute bilateral PE with CT evidence of right heart strain. She was requiring 4 L supplemental O2 via [place] on admission.   CT angio chest scan did show 8 mm lung nodule in left upper lobe  versus an area of developing infarct. There was no evidence of deep vein thrombosis in the lower extremities on recent US. She is currently on Eliquis.   Today, she is accompanied by her sister. Patient has a Hx of HTN, DM, and sleep apnea. She reports that she is a [employer].  Her symptoms began last Saturday when she felt short of breath once exiting her car. Her SOB presented suddenly. She continued to have breathing difficulties when walking her dog the next day and did sleep for most of that day.  She did endorse some chest discomfort at the time of her blood clot event, but did not have any significant pain. Patient did have headaches for two days at that time.  She reports that she generally endorses pain in her right side, which she calls her "weak side". She reports that she is unable to lie down due to right leg pain. This has been a chronic issue for several years, potentially related to arthritis. Patient therefore regularly sleeps in a recliner, causing her legs to swell.   Her bilateral leg swelling has been present prior to her SOB. She denies any new leg swelling or new pain in the legs. Patient has no pain in left leg. Her legs are swollen in the  mornings and does typically improve throughout the day. She has no leg swelling at this time.  She reports that she did have a syncope episode just before her hospitalization. Patient reports that after having a bowel movement, she could not wipe herself as she could not move her hands to wipe. Once she got up to wash her hands, she felt woozy and had a syncope episode.   She typically walks frequently and does not sit on the floor regularly. Patient denies any long distance travel or recent surgeries. No other new medications, hormonal exposures, or any other new changes. She is a never smoker.  She generally does experience leg cramps in the mornings. Patient reports that a couple weeks ago, she endorsed left leg cramping in her calf, which would not move. She reports that she tried stomping her leg and rubbing the area, and the cramp eventually resolved.   Patient will have her next mammogram this Saturday. She had her last cologuard test 1 year ago, which was negative.   Patient did have her ovaries and uterus removed due to multiple medical issues including abnormal bleeding and pre-eclampsia. She reports a hx of hiatal hernia repair and cholecystectomy.  Patient reports that she was taken off of Metformin yesterday due to frequent diarrhea and was switched to Glipizide.   She generally drinks three 16-ounce bottles of water daily. Patient does not generally drink tea or coffee. Patient does use compression socks regularly.  She reports  that she does not have a CPAP machine at home though it had been recommended to her previously.   Patient has been on Ozempic for 1 year, then stopped as it caused her to feel poorly with belching. Ozempic did not cause weight loss.   In regards to family history, her mother did have lung cancer and she was found to have lupus anticoagulant just before her passing. Her mother was a smoker. Patient's grandmother did have breast cancer diagnosed in 75s. There  is no other fhx of cancer, blood disorders, or bleeding disorders.   She denies any unexpected weight loss or other localized symptoms. No other new symptoms in the last 6 months. No major medication changes in the last 6 months.  MEDICAL HISTORY:  Past Medical History:  Diagnosis Date   Anxiety    Depression    Diabetes mellitus without complication (HCC)    type 2   Hypertension    OSA on CPAP     SURGICAL HISTORY: Past Surgical History:  Procedure Laterality Date   ABDOMINAL HYSTERECTOMY     BREAST BIOPSY Left    2010 benign   CESAREAN SECTION  2011   CHOLECYSTECTOMY      SOCIAL HISTORY: Social History   Socioeconomic History   Marital status: Married    Spouse name: Not on file   Number of children: Not on file   Years of education: Not on file   Highest education level: Not on file  Occupational History   Not on file  Tobacco Use   Smoking status: Never   Smokeless tobacco: Never  Substance and Sexual Activity   Alcohol use: Not Currently   Drug use: Not Currently   Sexual activity: Not on file  Other Topics Concern   Not on file  Social History Narrative   Not on file   Social Determinants of Health   Financial Resource Strain: Not on file  Food Insecurity: No Food Insecurity (12/26/2022)   Hunger Vital Sign    Worried About Running Out of Food in the Last Year: Never true    Ran Out of Food in the Last Year: Never true  Transportation Needs: No Transportation Needs (12/26/2022)   PRAPARE - Administrator, Civil Service (Medical): No    Lack of Transportation (Non-Medical): No  Physical Activity: Not on file  Stress: Not on file  Social Connections: Not on file  Intimate Partner Violence: Not At Risk (12/26/2022)   Humiliation, Afraid, Rape, and Kick questionnaire    Fear of Current or Ex-Partner: No    Emotionally Abused: No    Physically Abused: No    Sexually Abused: No    FAMILY HISTORY: Family History  Problem Relation Age  of Onset   Breast cancer Maternal Grandmother 58    ALLERGIES:  has No Known Allergies.  MEDICATIONS:  Current Outpatient Medications  Medication Sig Dispense Refill   Accu-Chek Softclix Lancets lancets Use as directed in the morning, at noon, and at bedtime. 100 each 0   Accu-Chek Softclix Lancets lancets Use as directed to check blood sugar in the morning, at noon, and at bedtime. 100 each 0   acetaminophen (TYLENOL) 500 MG tablet Take 2 tablets (1,000 mg total) by mouth every 6 (six) hours as needed for mild pain (pain score 1-3), fever or headache (or Fever >/= 101). 30 tablet 0   APIXABAN (ELIQUIS) VTE STARTER PACK (10MG  AND 5MG ) Take as directed on package: start with two-5mg  tablets  twice daily for 7 days. On day 8, switch to one-5mg  tablet twice daily. 74 each 0   Apple Cider Vinegar 500 MG TABS Take 1 tablet by mouth daily.     Blood Glucose Monitoring Suppl (BLOOD GLUCOSE MONITOR SYSTEM) w/Device KIT Use as directed in the morning, at noon, and at bedtime. 1 kit 0   Blood Glucose Monitoring Suppl DEVI Use 3 (three) times daily. May dispense any manufacturer covered by patient's insurance. 1 each 0   citalopram (CELEXA) 20 MG tablet Take 20 mg by mouth daily.     Glucose Blood (BLOOD GLUCOSE TEST STRIPS) STRP Use as directed in the morning, at noon and at bedtime. 100 strip 0   Glucose Blood (BLOOD GLUCOSE TEST STRIPS) STRP Use  3 (three) times daily. Use as directed to check blood sugar. May dispense any manufacturer covered by patient's insurance and fits patient's device. 100 strip 0   insulin glargine (LANTUS) 100 UNIT/ML Solostar Pen Inject 20 Units into the skin daily. 15 mL 0   Insulin Pen Needle (PEN NEEDLES) 31G X 5 MM MISC Use as directed 3 times daily with insulin. 100 each 0   Insulin Pen Needle (PEN NEEDLES) 32G X 5 MM MISC Use daily as directed 100 each 0   Lancet Device MISC Use in the morning, at noon, and at bedtime. May substitute to any manufacturer covered by  patient's insurance. 1 each 0   Lancet Device MISC Use 3 (three) times daily. May dispense any manufacturer covered by patient's insurance. 1 each 0   lisinopril (ZESTRIL) 20 MG tablet Take 1 tablet (20 mg total) by mouth daily. 30 tablet 0   metFORMIN (GLUCOPHAGE) 500 MG tablet Take 1 tablet (500 mg total) by mouth 2 (two) times daily with a meal. 60 tablet 0   ondansetron (ZOFRAN) 4 MG tablet Take 1 tablet (4 mg total) by mouth every 6 (six) hours as needed for nausea. 20 tablet 0   Semaglutide-Weight Management 0.5 MG/0.5ML SOAJ Inject 0.5 mg into the skin once a week. 2 mL 0   sodium chloride (OCEAN) 0.65 % SOLN nasal spray Place 1 spray into both nostrils as needed for congestion. 44 mL 0   No current facility-administered medications for this visit.    REVIEW OF SYSTEMS:    10 Point review of Systems was done is negative except as noted above.  PHYSICAL EXAMINATION: ECOG PERFORMANCE STATUS: 1 - Symptomatic but completely ambulatory  . Vitals:   01/06/23 0925  BP: 115/80  Pulse: 83  Resp: 18  Temp: 98.6 F (37 C)  SpO2: 99%   Filed Weights   01/06/23 0925  Weight: 283 lb (128.4 kg)   .Body mass index is 41.79 kg/m.  GENERAL:alert, in no acute distress and comfortable SKIN: no acute rashes, no significant lesions EYES: conjunctiva are pink and non-injected, sclera anicteric OROPHARYNX: MMM, no exudates, no oropharyngeal erythema or ulceration NECK: supple, no JVD LYMPH:  no palpable lymphadenopathy in the cervical, axillary or inguinal regions LUNGS: clear to auscultation b/l with normal respiratory effort HEART: regular rate & rhythm ABDOMEN:  normoactive bowel sounds , non tender, not distended. Extremity: no pedal edema PSYCH: alert & oriented x 3 with fluent speech NEURO: no focal motor/sensory deficits  LABORATORY DATA:  I have reviewed the data as listed  .    Latest Ref Rng & Units 12/28/2022    8:03 AM 12/27/2022    7:27 PM 12/26/2022   11:37 PM   CBC  WBC 4.0 - 10.5 K/uL 5.2  5.1  7.9   Hemoglobin 12.0 - 15.0 g/dL 01.0  27.2  53.6   Hematocrit 36.0 - 46.0 % 41.0  40.4  39.4   Platelets 150 - 400 K/uL 239  232  244     .    Latest Ref Rng & Units 12/29/2022    4:58 AM 12/28/2022    8:03 AM 12/26/2022   11:37 PM  CMP  Glucose 70 - 99 mg/dL 644  034  742   BUN 6 - 20 mg/dL 22  17  12    Creatinine 0.44 - 1.00 mg/dL 5.95  6.38  7.56   Sodium 135 - 145 mmol/L 138  136  135   Potassium 3.5 - 5.1 mmol/L 4.0  3.8  3.8   Chloride 98 - 111 mmol/L 100  102  103   CO2 22 - 32 mmol/L 27  24  24    Calcium 8.9 - 10.3 mg/dL 9.5  9.3  9.3      RADIOGRAPHIC STUDIES: I have personally reviewed the radiological images as listed and agreed with the findings in the report. ECHOCARDIOGRAM COMPLETE  Result Date: 12/28/2022    ECHOCARDIOGRAM REPORT   Patient Name:   KORYNN PAOLA [facility] Date of Exam: 12/28/2022 Medical Rec #:  [id number]               Height:       69.0 in Accession #:    [id number]              Weight:       303.0 lb Date of Birth:  [birth year]               BSA:          2.465 m Patient Age:    51 years                BP:           137/93 mmHg Patient Gender: F                       HR:           81 bpm. Exam Location:  Inpatient Procedure: 2D Echo, Cardiac Doppler, Color Doppler and Intracardiac            Opacification Agent Indications:    Pulmonary Embolus I26.09  History:        Patient has no prior history of Echocardiogram examinations.                 Risk Factors:Hypertension, Diabetes and Sleep Apnea.  Sonographer:    Lucendia Herrlich RCS Referring Phys: [phone] [facility]  Sonographer Comments: Patient is obese. IMPRESSIONS  1. Left ventricular ejection fraction, by estimation, is 60 to 65%. The left ventricle has normal function. The left ventricle has no regional wall motion abnormalities. There is severe concentric left ventricular hypertrophy. Left ventricular diastolic  parameters are indeterminate.  2. Right  ventricular systolic function is mildly reduced. The right ventricular size is severely enlarged.  3. The mitral valve is normal in structure. No evidence of mitral valve regurgitation. No evidence of mitral stenosis.  4. The aortic valve was not well visualized. Aortic valve regurgitation is not visualized. No aortic stenosis is present.  5. Technically difficult study. FINDINGS  Left Ventricle: Left ventricular ejection fraction, by estimation, is 60 to 65%. The left ventricle has normal function. The left ventricle  has no regional wall motion abnormalities. Definity contrast agent was given IV to delineate the left ventricular  endocardial borders. The left ventricular internal cavity size was small. There is severe concentric left ventricular hypertrophy. Left ventricular diastolic parameters are indeterminate. Right Ventricle: The right ventricular size is severely enlarged. Right vetricular wall thickness was not well visualized. Right ventricular systolic function is mildly reduced. Left Atrium: Left atrial size was normal in size. Right Atrium: Right atrial size was normal in size. Pericardium: Trivial pericardial effusion is present. The pericardial effusion is anterior to the right ventricle. Mitral Valve: The mitral valve is normal in structure. No evidence of mitral valve regurgitation. No evidence of mitral valve stenosis. Tricuspid Valve: The tricuspid valve is not well visualized. Tricuspid valve regurgitation is not demonstrated. Aortic Valve: The aortic valve was not well visualized. Aortic valve regurgitation is not visualized. No aortic stenosis is present. Aortic valve peak gradient measures 3.2 mmHg. Pulmonic Valve: The pulmonic valve was not well visualized. Pulmonic valve regurgitation is not visualized. No evidence of pulmonic stenosis. Aorta: The aortic root and ascending aorta are structurally normal, with no evidence of dilitation. IAS/Shunts: The interatrial septum was not well  visualized.  LEFT VENTRICLE PLAX 2D LVIDd:         3.90 cm LVIDs:         2.30 cm LV PW:         1.30 cm LV IVS:        1.30 cm LVOT diam:     2.10 cm LV SV:         38 LV SV Index:   15 LVOT Area:     3.46 cm  RIGHT VENTRICLE RV S prime:     18.30 cm/s LEFT ATRIUM           Index       RIGHT ATRIUM           Index LA diam:      2.90 cm 1.18 cm/m  RA Area:     12.10 cm LA Vol (A4C): 21.0 ml 8.52 ml/m  RA Volume:   31.10 ml  12.62 ml/m  AORTIC VALVE                 PULMONIC VALVE AV Area (Vmax): 2.70 cm     PR End Diast Vel: 9.49 msec AV Vmax:        89.60 cm/s AV Peak Grad:   3.2 mmHg LVOT Vmax:      69.97 cm/s LVOT Vmean:     45.000 cm/s LVOT VTI:       0.110 m  AORTA Ao Root diam: 3.20 cm Ao Asc diam:  3.20 cm TRICUSPID VALVE TR Peak grad:   10.5 mmHg TR Vmax:        162.00 cm/s  SHUNTS Systemic VTI:  0.11 m Systemic Diam: 2.10 cm Riley Lam MD Electronically signed by Riley Lam MD Signature Date/Time: 12/28/2022/9:52:43 AM    Final    VAS [place] LOWER EXTREMITY VENOUS (DVT)  Result Date: 12/27/2022  Lower Venous DVT Study Patient Name:  ALLETTA CALIXTO [facility] [place]  Date of Exam:   12/27/2022 Medical Rec #: [id number]                Accession #:    [id number] Date of Birth: October 06, 1970                Patient Gender: F Patient Age:   79 years Exam Location:  [facility] Procedure:      VAS [place] LOWER EXTREMITY VENOUS (DVT) Referring Phys: VISHAL PATEL --------------------------------------------------------------------------------  Indications: Pulmonary embolism.  Risk Factors: Confirmed PE. Anticoagulation: Heparin. Limitations: Poor ultrasound/tissue interface. Comparison Study: No prior studies. Performing Technologist: Chanda Busing RVT  Examination Guidelines: A complete evaluation includes B-mode imaging, spectral Doppler, color Doppler, and power Doppler as needed of all accessible portions of each vessel. Bilateral testing is considered an integral part of a complete  examination. Limited examinations for reoccurring indications may be performed as noted. The reflux portion of the exam is performed with the patient in reverse Trendelenburg.  +---------+---------------+---------+-----------+----------+--------------+ RIGHT    CompressibilityPhasicitySpontaneityPropertiesThrombus Aging +---------+---------------+---------+-----------+----------+--------------+ CFV      Full           Yes      Yes                                 +---------+---------------+---------+-----------+----------+--------------+ SFJ      Full                                                        +---------+---------------+---------+-----------+----------+--------------+ FV Prox  Full                                                        +---------+---------------+---------+-----------+----------+--------------+ FV Mid   Full                                                        +---------+---------------+---------+-----------+----------+--------------+ FV DistalFull                                                        +---------+---------------+---------+-----------+----------+--------------+ PFV      Full                                                        +---------+---------------+---------+-----------+----------+--------------+ POP      Full           Yes      Yes                                 +---------+---------------+---------+-----------+----------+--------------+ PTV      Full                                                        +---------+---------------+---------+-----------+----------+--------------+ PERO  Full                                                        +---------+---------------+---------+-----------+----------+--------------+   +---------+---------------+---------+-----------+----------+--------------+ LEFT     CompressibilityPhasicitySpontaneityPropertiesThrombus Aging  +---------+---------------+---------+-----------+----------+--------------+ CFV      Full           Yes      Yes                                 +---------+---------------+---------+-----------+----------+--------------+ SFJ      Full                                                        +---------+---------------+---------+-----------+----------+--------------+ FV Prox  Full                                                        +---------+---------------+---------+-----------+----------+--------------+ FV Mid   Full                                                        +---------+---------------+---------+-----------+----------+--------------+ FV DistalFull                                                        +---------+---------------+---------+-----------+----------+--------------+ PFV      Full                                                        +---------+---------------+---------+-----------+----------+--------------+ POP      Full           Yes      Yes                                 +---------+---------------+---------+-----------+----------+--------------+ PTV      Full                                                        +---------+---------------+---------+-----------+----------+--------------+ PERO     Full                                                        +---------+---------------+---------+-----------+----------+--------------+  Summary: RIGHT: - There is no evidence of deep vein thrombosis in the lower extremity.  - No cystic structure found in the popliteal fossa.  LEFT: - There is no evidence of deep vein thrombosis in the lower extremity.  - No cystic structure found in the popliteal fossa.  *See table(s) above for measurements and observations. Electronically signed by Gerarda Fraction on 12/27/2022 at 10:57:18 AM.    Final    CT HEAD WO CONTRAST ( )  Result Date: 12/27/2022 CLINICAL DATA:  Headache EXAM: CT  HEAD WITHOUT CONTRAST TECHNIQUE: Contiguous axial images were obtained from the base of the skull through the vertex without intravenous contrast. RADIATION DOSE REDUCTION: This exam was performed according to the departmental dose-optimization program which includes automated exposure control, adjustment of the mA and/or kV according to patient size and/or use of iterative reconstruction technique. COMPARISON:  None Available. FINDINGS: Brain: No mass,hemorrhage or extra-axial collection. Normal appearance of the parenchyma and CSF spaces. Vascular: No hyperdense vessel or unexpected vascular calcification. Skull: The visualized skull base, calvarium and extracranial soft tissues are normal. Sinuses/Orbits: No fluid levels or advanced mucosal thickening of the visualized paranasal sinuses. No mastoid or middle ear effusion. Normal orbits. IMPRESSION: Normal head CT. Electronically Signed   By: Deatra Robinson M.D.   On: 12/27/2022 03:03   CT Angio Chest PE W and/or Wo Contrast  Result Date: 12/26/2022 CLINICAL DATA:  Shortness of breath and chest pain. Hyperglycemia. Concern for pulmonary embolism. EXAM: CT ANGIOGRAPHY CHEST WITH CONTRAST TECHNIQUE: Multidetector CT imaging of the chest was performed using the standard protocol during bolus administration of intravenous contrast. Multiplanar CT image reconstructions and MIPs were obtained to evaluate the vascular anatomy. RADIATION DOSE REDUCTION: This exam was performed according to the departmental dose-optimization program which includes automated exposure control, adjustment of the mA and/or kV according to patient size and/or use of iterative reconstruction technique. CONTRAST:  75mL OMNIPAQUE IOHEXOL 350 MG/ML SOLN COMPARISON:  Chest radiograph dated 12/26/2022. FINDINGS: Evaluation is limited due to streak artifact caused by patient's arms. Cardiovascular: Top-normal cardiac size. No pericardial effusion. The thoracic aorta is unremarkable. Bilateral  pulmonary artery emboli involving the lobar branches of the upper and lower lobes. There is right heart straining with RV/LV ratio in the range of 1.1-1.3. Mediastinum/Nodes: No hilar or mediastinal adenopathy. The esophagus is grossly unremarkable. No mediastinal fluid collection. Lungs/Pleura: There is an 8 mm nodule in the left upper lobe versus an area of developing infarct. No consolidative changes. There is no pleural effusion or pneumothorax. The central airways are patent. Upper Abdomen: Fatty liver.  Cholecystectomy. Musculoskeletal: Degenerative changes of the spine. No acute osseous pathology. Review of the MIP images confirms the above findings. IMPRESSION: 1. Positive for acute PE with CT evidence of right heart strain (RV/LV Ratio = 1.2) consistent with at least submassive (intermediate risk) PE. The presence of right heart strain has been associated with an increased risk of morbidity and mortality. Please refer to the "Code PE Focused" order set in EPIC. 2. An 8 mm nodule in the left upper lobe versus an area of developing infarct. Non-contrast chest CT at 6-12 months is recommended. If the nodule is stable at time of repeat CT, then future CT at 18-24 months (from today's scan) is considered optional for low-risk patients, but is recommended for high-risk patients. This recommendation follows the consensus statement: Guidelines for Management of Incidental Pulmonary Nodules Detected on CT Images: From the Fleischner Society 2017; Radiology 2017; 284:228-243. 3. Fatty liver. These  results were called by telephone at the time of interpretation on 12/26/2022 at 6:10 pm to provider [facility] [email or web address] , who verbally acknowledged these results. Electronically Signed   By: Elgie Collard M.D.   On: 12/26/2022 18:13   DG Chest 2 View  Result Date: 12/26/2022 CLINICAL DATA:  Chest pain, shortness of breath EXAM: CHEST - 2 VIEW COMPARISON:  None Available. FINDINGS: The heart size and mediastinal contours  are within normal limits. Both lungs are clear. The visualized skeletal structures are unremarkable. IMPRESSION: No acute abnormality of the lungs. Electronically Signed   By: Jearld Lesch M.D.   On: 12/26/2022 17:17    ASSESSMENT & PLAN:  52 y.o. female with:  Bilateral pulmonary embolism   PLAN:  -recommend to continue NOAC oral anticoagulants such as eliquis or xarelto -avoid grape fruit juice with Eliquis. Discussed that Eliquis would not have any other food interactions and has less medication interactions -how long she should be on blood thinners would depend on whether there is an identified trigger and whether it is reversible -educated patient that if there was a one-time risk factor that is gone, there would be a role for blood thinners for at least 6 months. If there is an ongoing risk factor such as a genetic mutation, and there was no other trigerring event, one may need to be on life-long blood thinners. Discussed that if a risk factor is not identified, there may be a role for life-long blood thinners -discussed that a body mass index greater than 40 would be a soft risk factor for blood clots, but would not explain her sudden symptoms -will order blood tests for further evaluate for any acquired antibodies or inheritied clotting factors -recommend patient to stay well hydrated; frequently move around, especially if there is any long-distance travel, or if she sits at a computer regularly; continue to use compression socks regularly to reduce the amount of blood pooling in the legs; and optimize her body weight by staying regularly active.  -discussed that not using a CPAP machine may be causing her leg swelling  -recommend patient to use CPAP machine regularly to improve leg swelling -discussed option of an inspire device as an altternate to CPAP machine if she is a candidate -recommend blood tests for further evaluation. Depending on potential risk factors from findings, there  may be a role for a preventative dose of blood thinners based on findings to balance the risk of blood clots and bleeding.  -will have phone visit in 2 weeks to discuss labs -if findings are stable, we would plan to make a final decision on whether there is a role to adjust blood thinners at that time -repeat echocardiogram in 1 month -Unsure if small lung nodule in left upper lobe is part of the lung that had an infarct or if it is related to an evolving process. Small lung nodule will need to be followed up with repeat CT scan in 3-6 months.  -answered all of patient's and her sister's questions in detail -continue to stay UTD with age-appropriate cancer screenings including mammogram and colonoscopies  . Orders Placed This Encounter  Procedures   CBC with Differential (Cancer Center Only)    Standing Status:   Future    Number of Occurrences:   1    Standing Expiration Date:   01/06/2024   CMP (Cancer Center only)    Standing Status:   Future    Number of Occurrences:   1    Standing  Expiration Date:   01/06/2024   Antithrombin III    Standing Status:   Future    Number of Occurrences:   1    Standing Expiration Date:   01/06/2024   Protein C activity    Standing Status:   Future    Number of Occurrences:   1    Standing Expiration Date:   01/06/2024   Protein C, total    Standing Status:   Future    Number of Occurrences:   1    Standing Expiration Date:   01/06/2024   Protein S activity    Standing Status:   Future    Number of Occurrences:   1    Standing Expiration Date:   01/06/2024   Protein S, total    Standing Status:   Future    Number of Occurrences:   1    Standing Expiration Date:   01/06/2024   Lupus anticoagulant panel    Standing Status:   Future    Number of Occurrences:   1    Standing Expiration Date:   01/06/2024   Beta-2-glycoprotein i abs, IgG/M/A    Standing Status:   Future    Number of Occurrences:   1    Standing Expiration Date:   01/06/2024    Homocysteine, serum    Standing Status:   Future    Number of Occurrences:   1    Standing Expiration Date:   01/06/2024   Factor 5 leiden    Standing Status:   Future    Number of Occurrences:   1    Standing Expiration Date:   01/06/2024   Prothrombin gene mutation    Standing Status:   Future    Number of Occurrences:   1    Standing Expiration Date:   01/06/2024   Cardiolipin antibodies, IgG, IgM, IgA    Standing Status:   Future    Number of Occurrences:   1    Standing Expiration Date:   01/06/2024     FOLLOW-UP: Labs today Phone visit with Dr Candise Che in 2 weeks  The total time spent in the appointment was 60 minutes* .  All of the patient's questions were answered with apparent satisfaction. The patient knows to call the clinic with any problems, questions or concerns.   Wyvonnia Lora MD MS AAHIVMS [facility] Hematology/Oncology Physician [facility]  .*Total Encounter Time as defined by the Centers for Medicare and Medicaid Services includes, in addition to the face-to-face time of a patient visit (documented in the note above) non-face-to-face time: obtaining and reviewing outside history, ordering and reviewing medications, tests or procedures, care coordination (communications with other health care professionals or caregivers) and documentation in the medical record.    I,Mitra Faeizi,acting as a Neurosurgeon for Wyvonnia Lora, MD.,have documented all relevant documentation on the behalf of Wyvonnia Lora, MD,as directed by  Wyvonnia Lora, MD while in the presence of Wyvonnia Lora, MD.  .I have reviewed the above documentation for accuracy and completeness, and I agree with the above. Johney Maine MD

## 2023-01-07 ENCOUNTER — Other Ambulatory Visit: Payer: Self-pay | Admitting: Family Medicine

## 2023-01-07 ENCOUNTER — Other Ambulatory Visit: Payer: Self-pay

## 2023-01-07 DIAGNOSIS — Z1231 Encounter for screening mammogram for malignant neoplasm of breast: Secondary | ICD-10-CM

## 2023-01-07 LAB — DRVVT CONFIRM: dRVVT Confirm: 1 {ratio} (ref 0.8–1.2)

## 2023-01-07 LAB — PROTEIN S, TOTAL: Protein S Ag, Total: 101 % (ref 60–150)

## 2023-01-07 LAB — CARDIOLIPIN ANTIBODIES, IGG, IGM, IGA
Anticardiolipin IgA: <9 U/mL (ref 0–11)
Anticardiolipin IgG: 9 [GPL'U]/mL (ref 0–14)
Anticardiolipin IgM: <9 [MPL'U]/mL (ref 0–12)

## 2023-01-07 LAB — LUPUS ANTICOAGULANT PANEL
DRVVT: 63.2 s — ABNORMAL HIGH (ref 0.0–47.0)
PTT Lupus Anticoagulant: 38 s (ref 0.0–43.5)

## 2023-01-07 LAB — PROTEIN C ACTIVITY: Protein C Activity: 163 % (ref 73–180)

## 2023-01-07 LAB — BETA-2-GLYCOPROTEIN I ABS, IGG/M/A
Beta-2 Glyco I IgG: <9 GPI IgG units (ref 0–20)
Beta-2-Glycoprotein I IgA: <9 GPI IgA units (ref 0–25)
Beta-2-Glycoprotein I IgM: <9 GPI IgM units (ref 0–32)

## 2023-01-07 LAB — DRVVT MIX: dRVVT Mix: 48.4 s — ABNORMAL HIGH (ref 0.0–40.4)

## 2023-01-07 LAB — HOMOCYSTEINE: Homocysteine: 9.6 umol/L (ref 0.0–14.5)

## 2023-01-07 LAB — PROTEIN S ACTIVITY: Protein S Activity: 94 % (ref 63–140)

## 2023-01-08 ENCOUNTER — Ambulatory Visit
Admission: RE | Admit: 2023-01-08 | Discharge: 2023-01-08 | Disposition: A | Discharge status: Home / Self Care | Payer: BC Managed Care – PPO | Source: Ambulatory Visit | Attending: Family Medicine | Admitting: Family Medicine

## 2023-01-08 ENCOUNTER — Other Ambulatory Visit (HOSPITAL_COMMUNITY): Payer: Self-pay

## 2023-01-08 DIAGNOSIS — Z1231 Encounter for screening mammogram for malignant neoplasm of breast: Secondary | ICD-10-CM

## 2023-01-08 LAB — PROTEIN C, TOTAL: Protein C, Total: 147 % (ref 60–150)

## 2023-01-11 LAB — PROTHROMBIN GENE MUTATION

## 2023-01-13 LAB — FACTOR 5 LEIDEN

## 2023-01-18 ENCOUNTER — Inpatient Hospital Stay: Payer: BC Managed Care – PPO | Attending: Hematology | Admitting: Hematology

## 2023-01-18 DIAGNOSIS — Z7901 Long term (current) use of anticoagulants: Secondary | ICD-10-CM | POA: Insufficient documentation

## 2023-01-18 DIAGNOSIS — Z794 Long term (current) use of insulin: Secondary | ICD-10-CM | POA: Insufficient documentation

## 2023-01-18 DIAGNOSIS — R911 Solitary pulmonary nodule: Secondary | ICD-10-CM

## 2023-01-18 DIAGNOSIS — Z7984 Long term (current) use of oral hypoglycemic drugs: Secondary | ICD-10-CM | POA: Insufficient documentation

## 2023-01-18 DIAGNOSIS — E1165 Type 2 diabetes mellitus with hyperglycemia: Secondary | ICD-10-CM | POA: Diagnosis not present

## 2023-01-18 DIAGNOSIS — Z7985 Long-term (current) use of injectable non-insulin antidiabetic drugs: Secondary | ICD-10-CM | POA: Diagnosis not present

## 2023-01-18 DIAGNOSIS — G4733 Obstructive sleep apnea (adult) (pediatric): Secondary | ICD-10-CM | POA: Diagnosis not present

## 2023-01-18 DIAGNOSIS — I10 Essential (primary) hypertension: Secondary | ICD-10-CM | POA: Diagnosis not present

## 2023-01-18 DIAGNOSIS — Z79899 Other long term (current) drug therapy: Secondary | ICD-10-CM | POA: Insufficient documentation

## 2023-01-18 DIAGNOSIS — I2699 Other pulmonary embolism without acute cor pulmonale: Secondary | ICD-10-CM | POA: Diagnosis present

## 2023-01-18 NOTE — Progress Notes (Signed)
HEMATOLOGY/ONCOLOGY TELE-MED VISIT NOTE  Date of Service: 01/18/2023  Patient Care Team: Irena Reichmann, DO as PCP - General (Family Medicine)  CHIEF COMPLAINTS/PURPOSE OF CONSULTATION:  Evaluation and management of bilateral pulmonary embolism.  HISTORY OF PRESENTING ILLNESS:   Brittany Giles is a wonderful 52 y.o. female who has been referred to [place] by Irena Reichmann, DO for evaluation and management of bilateral pulmonary embolism.  She presented to the ED on 12/26/2022 for bilateral pulmonary embolism. CTA chest showed acute bilateral PE with CT evidence of right heart strain. She was requiring 4 L supplemental O2 via [place] on admission.   CT angio chest scan did show 8 mm lung nodule in left upper lobe  versus an area of developing infarct. There was no evidence of deep vein thrombosis in the lower extremities on recent US. She is currently on Eliquis.   Today, she is accompanied by her sister. Patient has a Hx of HTN, DM, and sleep apnea. She reports that she is a [employer].  Her symptoms began last Saturday when she felt short of breath once exiting her car. Her SOB presented suddenly. She continued to have breathing difficulties when walking her dog the next day and did sleep for most of that day.  She did endorse some chest discomfort at the time of her blood clot event, but did not have any significant pain. Patient did have headaches for two days at that time.  She reports that she generally endorses pain in her right side, which she calls her "weak side". She reports that she is unable to lie down due to right leg pain. This has been a chronic issue for several years, potentially related to arthritis. Patient therefore regularly sleeps in a recliner, causing her legs to swell.   Her bilateral leg swelling has been present prior to her SOB. She denies any new leg swelling or new pain in the legs. Patient has no pain in left leg. Her legs are swollen in the  mornings and does typically improve throughout the day. She has no leg swelling at this time.  She reports that she did have a syncope episode just before her hospitalization. Patient reports that after having a bowel movement, she could not wipe herself as she could not move her hands to wipe. Once she got up to wash her hands, she felt woozy and had a syncope episode.   She typically walks frequently and does not sit on the floor regularly. Patient denies any long distance travel or recent surgeries. No other new medications, hormonal exposures, or any other new changes. She is a never smoker.  She generally does experience leg cramps in the mornings. Patient reports that a couple weeks ago, she endorsed left leg cramping in her calf, which would not move. She reports that she tried stomping her leg and rubbing the area, and the cramp eventually resolved.   Patient will have her next mammogram this Saturday. She had her last cologuard test 1 year ago, which was negative.   Patient did have her ovaries and uterus removed due to multiple medical issues including abnormal bleeding and pre-eclampsia. She reports a hx of hiatal hernia repair and cholecystectomy.  Patient reports that she was taken off of Metformin yesterday due to frequent diarrhea and was switched to Glipizide.   She generally drinks three 16-ounce bottles of water daily. Patient does not generally drink tea or coffee. Patient does use compression socks regularly.  She  reports that she does not have a CPAP machine at home though it had been recommended to her previously.   Patient has been on Ozempic for 1 year, then stopped as it caused her to feel poorly with belching. Ozempic did not cause weight loss.   In regards to family history, her mother did have lung cancer and she was found to have lupus anticoagulant just before her passing. Her mother was a smoker. Patient's grandmother did have breast cancer diagnosed in 71s. There  is no other fhx of cancer, blood disorders, or bleeding disorders.   She denies any unexpected weight loss or other localized symptoms. No other new symptoms in the last 6 months. No major medication changes in the last 6 months.  INTERVAL HISTORY:  Brittany Giles is a wonderful 52 y.o. female who is here for continued evaluation and management of bilateral pulmonary embolism. Patient was initially seen by me on 01/06/2023.  .I connected with Brittany Giles on 01/18/2023 at  3:30 PM EST by telephone visit and verified that I am speaking with the correct person using two identifiers.   Patient notes she has been doing well overall since our last visit. She does complain of red eyes due to dry eyes since the morning. She denies any vision change or blurry vision.   She denies any chest pain or SOB. Patient notes that her breathing has improved since our last visit. She does complain of right leg pain, describes it as "dull achy pain." Patient notes that drinking water helps her pain.   She has been regularly taking her blood thinner and has been tolerating it well without any new or severe toxicities. She denies menstrual cycle.   She denies any new infection issues, fever, chills, night sweats, bone pain, back pain, chest pain, abdominal pain, or leg swelling.   Patient currently does not have a cpap machine at home. She has an upcoming appointment with sleep study on March 07, 2023.   Patient checks her blood pressure at home and it has been in the normal range.   We discussed lab results from 01/06/2023 in detail with the patient.   I discussed the limitations, risks, security and privacy concerns of performing an evaluation and management service by telemedicine and the availability of in-person appointments. I also discussed with the patient that there may be a patient responsible charge related to this service. The patient expressed understanding and agreed to  proceed.   Other persons participating in the visit and their role in the encounter: None   Patient's location: Home  Provider's location: [facility]   Chief Complaint: bilateral pulmonary embolism    MEDICAL HISTORY:  Past Medical History:  Diagnosis Date   Anxiety    Depression    Diabetes mellitus without complication (HCC)    type 2   Hypertension    OSA on CPAP     SURGICAL HISTORY: Past Surgical History:  Procedure Laterality Date   ABDOMINAL HYSTERECTOMY     BREAST BIOPSY Left    2010 benign   CESAREAN SECTION  2011   CHOLECYSTECTOMY      SOCIAL HISTORY: Social History   Socioeconomic History   Marital status: Married    Spouse name: Not on file   Number of children: Not on file   Years of education: Not on file   Highest education level: Not on file  Occupational History   Not on file  Tobacco Use   Smoking status:  Never   Smokeless tobacco: Never  Substance and Sexual Activity   Alcohol use: Not Currently   Drug use: Not Currently   Sexual activity: Not on file  Other Topics Concern   Not on file  Social History Narrative   Not on file   Social Determinants of Health   Financial Resource Strain: Not on file  Food Insecurity: No Food Insecurity (12/26/2022)   Hunger Vital Sign    Worried About Running Out of Food in the Last Year: Never true    Ran Out of Food in the Last Year: Never true  Transportation Needs: No Transportation Needs (12/26/2022)   PRAPARE - Administrator, Civil Service (Medical): No    Lack of Transportation (Non-Medical): No  Physical Activity: Not on file  Stress: Not on file  Social Connections: Not on file  Intimate Partner Violence: Not At Risk (12/26/2022)   Humiliation, Afraid, Rape, and Kick questionnaire    Fear of Current or Ex-Partner: No    Emotionally Abused: No    Physically Abused: No    Sexually Abused: No    FAMILY HISTORY: Family History  Problem Relation Age of Onset   Breast cancer  Maternal Grandmother 12    ALLERGIES:  has No Known Allergies.  MEDICATIONS:  Current Outpatient Medications  Medication Sig Dispense Refill   Accu-Chek Softclix Lancets lancets Use as directed in the morning, at noon, and at bedtime. 100 each 0   Accu-Chek Softclix Lancets lancets Use as directed to check blood sugar in the morning, at noon, and at bedtime. 100 each 0   acetaminophen (TYLENOL) 500 MG tablet Take 2 tablets (1,000 mg total) by mouth every 6 (six) hours as needed for mild pain (pain score 1-3), fever or headache (or Fever >/= 101). 30 tablet 0   apixaban (ELIQUIS) 5 MG TABS tablet Take 1 tablet (5 mg total) by mouth 2 (two) times daily. Start after completion of Eliquis starter pack 60 tablet 4   APIXABAN (ELIQUIS) VTE STARTER PACK (10MG  AND 5MG ) Take as directed on package: start with two-5mg  tablets twice daily for 7 days. On day 8, switch to one-5mg  tablet twice daily. 74 each 0   Apple Cider Vinegar 500 MG TABS Take 1 tablet by mouth daily.     Blood Glucose Monitoring Suppl (BLOOD GLUCOSE MONITOR SYSTEM) w/Device KIT Use as directed in the morning, at noon, and at bedtime. 1 kit 0   Blood Glucose Monitoring Suppl DEVI Use 3 (three) times daily. May dispense any manufacturer covered by patient's insurance. 1 each 0   citalopram (CELEXA) 20 MG tablet Take 20 mg by mouth daily.     Glucose Blood (BLOOD GLUCOSE TEST STRIPS) STRP Use as directed in the morning, at noon and at bedtime. 100 strip 0   Glucose Blood (BLOOD GLUCOSE TEST STRIPS) STRP Use  3 (three) times daily. Use as directed to check blood sugar. May dispense any manufacturer covered by patient's insurance and fits patient's device. 100 strip 0   insulin glargine (LANTUS) 100 UNIT/ML Solostar Pen Inject 20 Units into the skin daily. 15 mL 0   Insulin Pen Needle (PEN NEEDLES) 31G X 5 MM MISC Use as directed 3 times daily with insulin. 100 each 0   Insulin Pen Needle (PEN NEEDLES) 32G X 5 MM MISC Use daily as directed  100 each 0   Lancet Device MISC Use in the morning, at noon, and at bedtime. May substitute to any manufacturer covered  by patient's insurance. 1 each 0   Lancet Device MISC Use 3 (three) times daily. May dispense any manufacturer covered by patient's insurance. 1 each 0   lisinopril (ZESTRIL) 20 MG tablet Take 1 tablet (20 mg total) by mouth daily. 30 tablet 0   metFORMIN (GLUCOPHAGE) 500 MG tablet Take 1 tablet (500 mg total) by mouth 2 (two) times daily with a meal. 60 tablet 0   ondansetron (ZOFRAN) 4 MG tablet Take 1 tablet (4 mg total) by mouth every 6 (six) hours as needed for nausea. 20 tablet 0   Semaglutide-Weight Management 0.5 MG/0.5ML SOAJ Inject 0.5 mg into the skin once a week. 2 mL 0   sodium chloride (OCEAN) 0.65 % SOLN nasal spray Place 1 spray into both nostrils as needed for congestion. 44 mL 0   No current facility-administered medications for this visit.    REVIEW OF SYSTEMS:    10 Point review of Systems was done is negative except as noted above.  PHYSICAL EXAMINATION: TELE-MED VISIT  LABORATORY DATA:  I have reviewed the data as listed  .    Latest Ref Rng & Units 01/06/2023   10:08 AM 12/28/2022    8:03 AM 12/27/2022    7:27 PM  CBC  WBC 4.0 - 10.5 K/uL 5.6  5.2  5.1   Hemoglobin 12.0 - 15.0 g/dL 19.1  47.8  29.5   Hematocrit 36.0 - 46.0 % 36.1  41.0  40.4   Platelets 150 - 400 K/uL 333  239  232     .    Latest Ref Rng & Units 01/06/2023   10:08 AM 12/29/2022    4:58 AM 12/28/2022    8:03 AM  CMP  Glucose 70 - 99 mg/dL 621  308  657   BUN 6 - 20 mg/dL 17  22  17    Creatinine 0.44 - 1.00 mg/dL 8.46  9.62  9.52   Sodium 135 - 145 mmol/L 139  138  136   Potassium 3.5 - 5.1 mmol/L 3.8  4.0  3.8   Chloride 98 - 111 mmol/L 104  100  102   CO2 22 - 32 mmol/L 26  27  24    Calcium 8.9 - 10.3 mg/dL 9.3  9.5  9.3   Total Protein 6.5 - 8.1 g/dL 7.9     Total Bilirubin 0.3 - 1.2 mg/dL 0.5     Alkaline Phos 38 - 126 U/L 65     AST 15 - 41 U/L 25      ALT 0 - 44 U/L 29        RADIOGRAPHIC STUDIES: I have personally reviewed the radiological images as listed and agreed with the findings in the report. MM 3D SCREENING MAMMOGRAM BILATERAL BREAST  Result Date: 01/11/2023 CLINICAL DATA:  Screening. EXAM: DIGITAL SCREENING BILATERAL MAMMOGRAM WITH TOMOSYNTHESIS AND CAD TECHNIQUE: Bilateral screening digital craniocaudal and mediolateral oblique mammograms were obtained. Bilateral screening digital breast tomosynthesis was performed. The images were evaluated with computer-aided detection. COMPARISON:  Previous exam(s). ACR Breast Density Category a: The breasts are almost entirely fatty. FINDINGS: There are no findings suspicious for malignancy. IMPRESSION: No mammographic evidence of malignancy. A result letter of this screening mammogram will be mailed directly to the patient. RECOMMENDATION: Screening mammogram in one year. (Code:SM-B-01Y) BI-RADS CATEGORY  1: Negative. Electronically Signed   By: Edwin Cap M.D.   On: 01/11/2023 12:30   ECHOCARDIOGRAM COMPLETE  Result Date: 12/28/2022    ECHOCARDIOGRAM REPORT   Patient  Name:   Brittany Giles [facility] Date of Exam: 12/28/2022 Medical Rec #:  [id number]               Height:       69.0 in Accession #:    [id number]              Weight:       303.0 lb Date of Birth:  25-Mar-1970               BSA:          2.465 m Patient Age:    51 years                BP:           137/93 mmHg Patient Gender: F                       HR:           81 bpm. Exam Location:  Inpatient Procedure: 2D Echo, Cardiac Doppler, Color Doppler and Intracardiac            Opacification Agent Indications:    Pulmonary Embolus I26.09  History:        Patient has no prior history of Echocardiogram examinations.                 Risk Factors:Hypertension, Diabetes and Sleep Apnea.  Sonographer:    Lucendia Herrlich RCS Referring Phys: [phone] [facility]  Sonographer Comments: Patient is obese. IMPRESSIONS  1. Left ventricular  ejection fraction, by estimation, is 60 to 65%. The left ventricle has normal function. The left ventricle has no regional wall motion abnormalities. There is severe concentric left ventricular hypertrophy. Left ventricular diastolic  parameters are indeterminate.  2. Right ventricular systolic function is mildly reduced. The right ventricular size is severely enlarged.  3. The mitral valve is normal in structure. No evidence of mitral valve regurgitation. No evidence of mitral stenosis.  4. The aortic valve was not well visualized. Aortic valve regurgitation is not visualized. No aortic stenosis is present.  5. Technically difficult study. FINDINGS  Left Ventricle: Left ventricular ejection fraction, by estimation, is 60 to 65%. The left ventricle has normal function. The left ventricle has no regional wall motion abnormalities. Definity contrast agent was given IV to delineate the left ventricular  endocardial borders. The left ventricular internal cavity size was small. There is severe concentric left ventricular hypertrophy. Left ventricular diastolic parameters are indeterminate. Right Ventricle: The right ventricular size is severely enlarged. Right vetricular wall thickness was not well visualized. Right ventricular systolic function is mildly reduced. Left Atrium: Left atrial size was normal in size. Right Atrium: Right atrial size was normal in size. Pericardium: Trivial pericardial effusion is present. The pericardial effusion is anterior to the right ventricle. Mitral Valve: The mitral valve is normal in structure. No evidence of mitral valve regurgitation. No evidence of mitral valve stenosis. Tricuspid Valve: The tricuspid valve is not well visualized. Tricuspid valve regurgitation is not demonstrated. Aortic Valve: The aortic valve was not well visualized. Aortic valve regurgitation is not visualized. No aortic stenosis is present. Aortic valve peak gradient measures 3.2 mmHg. Pulmonic Valve: The  pulmonic valve was not well visualized. Pulmonic valve regurgitation is not visualized. No evidence of pulmonic stenosis. Aorta: The aortic root and ascending aorta are structurally normal, with no evidence of dilitation. IAS/Shunts: The interatrial septum was not well visualized.  LEFT VENTRICLE PLAX 2D LVIDd:  3.90 cm LVIDs:         2.30 cm LV PW:         1.30 cm LV IVS:        1.30 cm LVOT diam:     2.10 cm LV SV:         38 LV SV Index:   15 LVOT Area:     3.46 cm  RIGHT VENTRICLE RV S prime:     18.30 cm/s LEFT ATRIUM           Index       RIGHT ATRIUM           Index LA diam:      2.90 cm 1.18 cm/m  RA Area:     12.10 cm LA Vol (A4C): 21.0 ml 8.52 ml/m  RA Volume:   31.10 ml  12.62 ml/m  AORTIC VALVE                 PULMONIC VALVE AV Area (Vmax): 2.70 cm     PR End Diast Vel: 9.49 msec AV Vmax:        89.60 cm/s AV Peak Grad:   3.2 mmHg LVOT Vmax:      69.97 cm/s LVOT Vmean:     45.000 cm/s LVOT VTI:       0.110 m  AORTA Ao Root diam: 3.20 cm Ao Asc diam:  3.20 cm TRICUSPID VALVE TR Peak grad:   10.5 mmHg TR Vmax:        162.00 cm/s  SHUNTS Systemic VTI:  0.11 m Systemic Diam: 2.10 cm Riley Lam MD Electronically signed by Riley Lam MD Signature Date/Time: 12/28/2022/9:52:43 AM    Final    VAS [place] LOWER EXTREMITY VENOUS (DVT)  Result Date: 12/27/2022  Lower Venous DVT Study Patient Name:  TOMMY ISACKSON [facility]  Date of Exam:   12/27/2022 Medical Rec #: [id number]                Accession #:    [id number] Date of Birth: 1970-12-27                Patient Gender: F Patient Age:   56 years Exam Location:  [facility] Procedure:      VAS [place] LOWER EXTREMITY VENOUS (DVT) Referring Phys: Eston Esters PATEL --------------------------------------------------------------------------------  Indications: Pulmonary embolism.  Risk Factors: Confirmed PE. Anticoagulation: Heparin. Limitations: Poor ultrasound/tissue interface. Comparison Study: No prior studies. Performing  Technologist: Chanda Busing RVT  Examination Guidelines: A complete evaluation includes B-mode imaging, spectral Doppler, color Doppler, and power Doppler as needed of all accessible portions of each vessel. Bilateral testing is considered an integral part of a complete examination. Limited examinations for reoccurring indications may be performed as noted. The reflux portion of the exam is performed with the patient in reverse Trendelenburg.  +---------+---------------+---------+-----------+----------+--------------+ RIGHT    CompressibilityPhasicitySpontaneityPropertiesThrombus Aging +---------+---------------+---------+-----------+----------+--------------+ CFV      Full           Yes      Yes                                 +---------+---------------+---------+-----------+----------+--------------+ SFJ      Full                                                        +---------+---------------+---------+-----------+----------+--------------+  FV Prox  Full                                                        +---------+---------------+---------+-----------+----------+--------------+ FV Mid   Full                                                        +---------+---------------+---------+-----------+----------+--------------+ FV DistalFull                                                        +---------+---------------+---------+-----------+----------+--------------+ PFV      Full                                                        +---------+---------------+---------+-----------+----------+--------------+ POP      Full           Yes      Yes                                 +---------+---------------+---------+-----------+----------+--------------+ PTV      Full                                                        +---------+---------------+---------+-----------+----------+--------------+ PERO     Full                                                         +---------+---------------+---------+-----------+----------+--------------+   +---------+---------------+---------+-----------+----------+--------------+ LEFT     CompressibilityPhasicitySpontaneityPropertiesThrombus Aging +---------+---------------+---------+-----------+----------+--------------+ CFV      Full           Yes      Yes                                 +---------+---------------+---------+-----------+----------+--------------+ SFJ      Full                                                        +---------+---------------+---------+-----------+----------+--------------+ FV Prox  Full                                                        +---------+---------------+---------+-----------+----------+--------------+  FV Mid   Full                                                        +---------+---------------+---------+-----------+----------+--------------+ FV DistalFull                                                        +---------+---------------+---------+-----------+----------+--------------+ PFV      Full                                                        +---------+---------------+---------+-----------+----------+--------------+ POP      Full           Yes      Yes                                 +---------+---------------+---------+-----------+----------+--------------+ PTV      Full                                                        +---------+---------------+---------+-----------+----------+--------------+ PERO     Full                                                        +---------+---------------+---------+-----------+----------+--------------+     Summary: RIGHT: - There is no evidence of deep vein thrombosis in the lower extremity.  - No cystic structure found in the popliteal fossa.  LEFT: - There is no evidence of deep vein thrombosis in the lower extremity.  - No cystic structure found in  the popliteal fossa.  *See table(s) above for measurements and observations. Electronically signed by Gerarda Fraction on 12/27/2022 at 10:57:18 AM.    Final    CT HEAD WO CONTRAST ( )  Result Date: 12/27/2022 CLINICAL DATA:  Headache EXAM: CT HEAD WITHOUT CONTRAST TECHNIQUE: Contiguous axial images were obtained from the base of the skull through the vertex without intravenous contrast. RADIATION DOSE REDUCTION: This exam was performed according to the departmental dose-optimization program which includes automated exposure control, adjustment of the mA and/or kV according to patient size and/or use of iterative reconstruction technique. COMPARISON:  None Available. FINDINGS: Brain: No mass,hemorrhage or extra-axial collection. Normal appearance of the parenchyma and CSF spaces. Vascular: No hyperdense vessel or unexpected vascular calcification. Skull: The visualized skull base, calvarium and extracranial soft tissues are normal. Sinuses/Orbits: No fluid levels or advanced mucosal thickening of the visualized paranasal sinuses. No mastoid or middle ear effusion. Normal orbits. IMPRESSION: Normal head CT. Electronically Signed   By: Deatra Robinson M.D.   On: 12/27/2022 03:03   CT Angio Chest PE W and/or  Wo Contrast  Result Date: 12/26/2022 CLINICAL DATA:  Shortness of breath and chest pain. Hyperglycemia. Concern for pulmonary embolism. EXAM: CT ANGIOGRAPHY CHEST WITH CONTRAST TECHNIQUE: Multidetector CT imaging of the chest was performed using the standard protocol during bolus administration of intravenous contrast. Multiplanar CT image reconstructions and MIPs were obtained to evaluate the vascular anatomy. RADIATION DOSE REDUCTION: This exam was performed according to the departmental dose-optimization program which includes automated exposure control, adjustment of the mA and/or kV according to patient size and/or use of iterative reconstruction technique. CONTRAST:  75mL OMNIPAQUE IOHEXOL 350 MG/ML  SOLN COMPARISON:  Chest radiograph dated 12/26/2022. FINDINGS: Evaluation is limited due to streak artifact caused by patient's arms. Cardiovascular: Top-normal cardiac size. No pericardial effusion. The thoracic aorta is unremarkable. Bilateral pulmonary artery emboli involving the lobar branches of the upper and lower lobes. There is right heart straining with RV/LV ratio in the range of 1.1-1.3. Mediastinum/Nodes: No hilar or mediastinal adenopathy. The esophagus is grossly unremarkable. No mediastinal fluid collection. Lungs/Pleura: There is an 8 mm nodule in the left upper lobe versus an area of developing infarct. No consolidative changes. There is no pleural effusion or pneumothorax. The central airways are patent. Upper Abdomen: Fatty liver.  Cholecystectomy. Musculoskeletal: Degenerative changes of the spine. No acute osseous pathology. Review of the MIP images confirms the above findings. IMPRESSION: 1. Positive for acute PE with CT evidence of right heart strain (RV/LV Ratio = 1.2) consistent with at least submassive (intermediate risk) PE. The presence of right heart strain has been associated with an increased risk of morbidity and mortality. Please refer to the "Code PE Focused" order set in EPIC. 2. An 8 mm nodule in the left upper lobe versus an area of developing infarct. Non-contrast chest CT at 6-12 months is recommended. If the nodule is stable at time of repeat CT, then future CT at 18-24 months (from today's scan) is considered optional for low-risk patients, but is recommended for high-risk patients. This recommendation follows the consensus statement: Guidelines for Management of Incidental Pulmonary Nodules Detected on CT Images: From the Fleischner Society 2017; Radiology 2017; 284:228-243. 3. Fatty liver. These results were called by telephone at the time of interpretation on 12/26/2022 at 6:10 pm to provider [facility] ROEMHILDT , who verbally acknowledged these results. Electronically Signed    By: Elgie Collard M.D.   On: 12/26/2022 18:13   DG Chest 2 View  Result Date: 12/26/2022 CLINICAL DATA:  Chest pain, shortness of breath EXAM: CHEST - 2 VIEW COMPARISON:  None Available. FINDINGS: The heart size and mediastinal contours are within normal limits. Both lungs are clear. The visualized skeletal structures are unremarkable. IMPRESSION: No acute abnormality of the lungs. Electronically Signed   By: Jearld Lesch M.D.   On: 12/26/2022 17:17    ASSESSMENT & PLAN:   52 y.o. female with:  Bilateral pulmonary embolism   PLAN: -Discussed lab results from 01/06/2023 in detail with the patient. CBC shows slightly decreased hemoglobin of 11.6 g/dL, but stable overall. CMP was stable. dRVVT confirm of 1.0. Anticardiolipin IgG, IgM, and IgA in the normal range. Beta-2 glycoprotein IgG, IgM, and IgA level in the normal range. No lupus anticoagulant was detected. Protein-S total at 101% with Protein-S level of 94% Homocysteine level is 9.6. Protein-C total of 147% with protein-C activity of 163%. Antithrombin III function of 103%.  -Factor 5 leiden is not detected. Prothrombin gene mutation not detected.  -Recommend to continue full dose blood thinner, Eliquis, for six months.  5 mg twice a day.  -Repeat echocardiogram in 2 months.  -repeat CT chest scan in 2 months. Before our next visit.  -Continue to follow-up with PCP. -Answered all of patient's questions.   FOLLOW-UP: ECHO in 1st week of Jan 2025 CT Chest wo contrast in 1st week of Jan 2025 RTC with Dr Candise Che in 3rd week of Jan 2025  The total time spent in the appointment was 20 minutes* .  All of the patient's questions were answered with apparent satisfaction. The patient knows to call the clinic with any problems, questions or concerns.   Wyvonnia Lora MD MS AAHIVMS [facility] Hematology/Oncology Physician [facility]  .*Total Encounter Time as defined by the Centers for Medicare and Medicaid Services includes, in  addition to the face-to-face time of a patient visit (documented in the note above) non-face-to-face time: obtaining and reviewing outside history, ordering and reviewing medications, tests or procedures, care coordination (communications with other health care professionals or caregivers) and documentation in the medical record.   I,Param Shah,acting as a Neurosurgeon for Wyvonnia Lora, MD.,have documented all relevant documentation on the behalf of Wyvonnia Lora, MD,as directed by  Wyvonnia Lora, MD while in the presence of Wyvonnia Lora, MD.

## 2023-01-19 ENCOUNTER — Telehealth: Payer: Self-pay | Admitting: Hematology

## 2023-01-19 NOTE — Telephone Encounter (Signed)
Left patient a message in regards to scheduled appointment times/dates for follow up visit with Dr. Candise Che

## 2023-03-07 ENCOUNTER — Ambulatory Visit (INDEPENDENT_AMBULATORY_CARE_PROVIDER_SITE_OTHER): Payer: BC Managed Care – PPO | Admitting: Pulmonary Disease

## 2023-03-07 VITALS — BP 134/79 | HR 77 | Wt 281.4 lb

## 2023-03-07 DIAGNOSIS — G4733 Obstructive sleep apnea (adult) (pediatric): Secondary | ICD-10-CM

## 2023-03-07 NOTE — Patient Instructions (Signed)
We will schedule you for home sleep study  I will see you back in about 3 months  Continue weight loss efforts  Call us with significant concerns  Sleep Apnea Sleep apnea affects breathing during sleep. It causes breathing to stop for 10 seconds or more, or to become shallow. People with sleep apnea usually snore loudly. It can also increase the risk of: Heart attack. Stroke. Being very overweight (obese). Diabetes. Heart failure. Irregular heartbeat. High blood pressure. The goal of treatment is to help you breathe normally again. What are the causes?  The most common cause of this condition is a collapsed or blocked airway. There are three kinds of sleep apnea: Obstructive sleep apnea. This is caused by a blocked or collapsed airway. Central sleep apnea. This happens when the brain does not send the right signals to the muscles that control breathing. Mixed sleep apnea. This is a combination of obstructive and central sleep apnea. What increases the risk? Being overweight. Smoking. Having a small airway. Being older. Being female. Drinking alcohol. Taking medicines to calm yourself (sedatives or tranquilizers). Having family members with the condition. Having a tongue or tonsils that are larger than normal. What are the signs or symptoms? Trouble staying asleep. Loud snoring. Headaches in the morning. Waking up gasping. Dry mouth or sore throat in the morning. Being sleepy or tired during the day. If you are sleepy or tired during the day, you may also: Not be able to focus your mind (concentrate). Forget things. Get angry a lot and have mood swings. Feel sad (depressed). Have changes in your personality. Have less interest in sex, if you are female. Be unable to have an erection, if you are female. How is this treated?  Sleeping on your side. Using a medicine to get rid of mucus in your nose (decongestant). Avoiding the use of alcohol, medicines to help you  relax, or certain pain medicines (narcotics). Losing weight, if needed. Changing your diet. Quitting smoking. Using a machine to open your airway while you sleep, such as: An oral appliance. This is a mouthpiece that shifts your lower jaw forward. A CPAP device. This device blows air through a mask when you breathe out (exhale). An EPAP device. This has valves that you put in each nostril. A BIPAP device. This device blows air through a mask when you breathe in (inhale) and breathe out. Having surgery if other treatments do not work. Follow these instructions at home: Lifestyle Make changes that your doctor recommends. Eat a healthy diet. Lose weight if needed. Avoid alcohol, medicines to help you relax, and some pain medicines. Do not smoke or use any products that contain nicotine or tobacco. If you need help quitting, ask your doctor. General instructions Take over-the-counter and prescription medicines only as told by your doctor. If you were given a machine to use while you sleep, use it only as told by your doctor. If you are having surgery, make sure to tell your doctor you have sleep apnea. You may need to bring your device with you. Keep all follow-up visits. Contact a doctor if: The machine that you were given to use during sleep bothers you or does not seem to be working. You do not get better. You get worse. Get help right away if: Your chest hurts. You have trouble breathing in enough air. You have an uncomfortable feeling in your back, arms, or stomach. You have trouble talking. One side of your body feels weak. A part of your  face is hanging down. These symptoms may be an emergency. Get help right away. Call your local emergency services (911 in the [place] Do not wait to see if the symptoms will go away. Do not drive yourself to the hospital. Summary This condition affects breathing during sleep. The most common cause is a collapsed or blocked airway. The goal of  treatment is to help you breathe normally while you sleep. This information is not intended to replace advice given to you by your health care provider. Make sure you discuss any questions you have with your health care provider. Document Revised: 10/08/2020 Document Reviewed: 02/08/2020 Elsevier Patient Education  2024 [employer].

## 2023-03-07 NOTE — Progress Notes (Unsigned)
Brittany Giles    [id number]    1970/06/15  Primary Care Physician:Collins, Annabelle Harman, DO  Referring Physician: Irena Reichmann, DO [address] [place],  [place] [address]  Chief complaint:   Patient being seen for history of sleep apnea  HPI:  Diagnosed with sleep apnea over 10 years ago Use CPAP for about a year and a half and last time insurance and could not continue using it  Will he goes to bed between 10 and 11 PM Takes about 5 minutes to fall asleep 1 awakening Final wake up time about 5 AM  Weight has gone up recently  [facility] sleep [facility] a [facility]  Does have a history of hypertension, 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 Having more symptoms recently Nonrestrictive sleep, gasping respirations at night  Recently had blood clots affecting the right side of the heart which is leaving a concerned going forward  Sleep apnea was found to be severe when she was diagnosed  Slight decline in her memory, multiple family members with obstructive sleep apnea Was using a fullface mask because of mouth breathing  Never smoker  No pertinent occupational history Outpatient Encounter Medications as of 03/07/2023  Medication Sig   Accu-Chek Softclix Lancets lancets Use as directed to check blood sugar in the morning, at noon, and at bedtime.   acetaminophen (TYLENOL) 500 MG tablet Take 2 tablets (1,000 mg total) by mouth every 6 (six) hours as needed for mild pain (pain score 1-3), fever or headache (or Fever >/= 101).   apixaban (ELIQUIS) 5 MG TABS tablet Take 1 tablet (5 mg total) by mouth 2 (two) times daily. Start after completion of Eliquis starter pack   APIXABAN (ELIQUIS) VTE STARTER PACK (10MG  AND 5MG ) Take as directed on package: start with two-5mg  tablets twice daily for 7 days. On day 8, switch to one-5mg  tablet twice daily.   Blood Glucose Monitoring Suppl (BLOOD GLUCOSE MONITOR SYSTEM) w/Device KIT Use as  directed in the morning, at noon, and at bedtime.   Blood Glucose Monitoring Suppl DEVI Use 3 (three) times daily. May dispense any manufacturer covered by patient's insurance.   citalopram (CELEXA) 20 MG tablet Take 20 mg by mouth daily.   Glucose Blood (BLOOD GLUCOSE TEST STRIPS) STRP Use  3 (three) times daily. Use as directed to check blood sugar. May dispense any manufacturer covered by patient's insurance and fits patient's device.   insulin glargine (LANTUS) 100 UNIT/ML Solostar Pen Inject 20 Units into the skin daily.   Insulin Pen Needle (PEN NEEDLES) 31G X 5 MM MISC Use as directed 3 times daily with insulin.   Insulin Pen Needle (PEN NEEDLES) 32G X 5 MM MISC Use daily as directed   Lancet Device MISC Use 3 (three) times daily. May dispense any manufacturer covered by patient's insurance.   lisinopril (ZESTRIL) 20 MG tablet Take 1 tablet (20 mg total) by mouth daily.   Semaglutide-Weight Management 0.5 MG/0.5ML SOAJ Inject 0.5 mg into the skin once a week.   [DISCONTINUED] Apple Cider Vinegar 500 MG TABS Take 1 tablet by mouth daily.   [DISCONTINUED] metFORMIN (GLUCOPHAGE) 500 MG tablet Take 1 tablet (500 mg total) by mouth 2 (two) times daily with a meal.   [DISCONTINUED] ondansetron (ZOFRAN) 4 MG tablet Take 1 tablet (4 mg total) by mouth every 6 (six) hours as needed for nausea.   [DISCONTINUED] sodium chloride (OCEAN) 0.65 % SOLN nasal spray Place  1 spray into both nostrils as needed for congestion.   No facility-administered encounter medications on file as of 03/07/2023.    Allergies as of 03/07/2023   (No Known Allergies)    Past Medical History:  Diagnosis Date   Anxiety    Depression    Diabetes mellitus without complication (HCC)    type 2   Hypertension    OSA on CPAP     Past Surgical History:  Procedure Laterality Date   ABDOMINAL HYSTERECTOMY     BREAST BIOPSY Left    2010 benign   CESAREAN SECTION  2011   CHOLECYSTECTOMY      Family History  Problem  Relation Age of Onset   Breast cancer Maternal Grandmother 73    Social History   Socioeconomic History   Marital status: Married    Spouse name: Not on file   Number of children: Not on file   Years of education: Not on file   Highest education level: Not on file  Occupational History   Not on file  Tobacco Use   Smoking status: Never   Smokeless tobacco: Never  Substance and Sexual Activity   Alcohol use: Not Currently   Drug use: Not Currently   Sexual activity: Not on file  Other Topics Concern   Not on file  Social History Narrative   Not on file   Social Drivers of Health   Financial Resource Strain: Not on file  Food Insecurity: No Food Insecurity (12/26/2022)   Hunger Vital Sign    Worried About Running Out of Food in the Last Year: Never true    Ran Out of Food in the Last Year: Never true  Transportation Needs: No Transportation Needs (12/26/2022)   PRAPARE - Administrator, Civil Service (Medical): No    Lack of Transportation (Non-Medical): No  Physical Activity: Not on file  Stress: Not on file  Social Connections: Not on file  Intimate Partner Violence: Not At Risk (12/26/2022)   Humiliation, Afraid, Rape, and Kick questionnaire    Fear of Current or Ex-Partner: No    Emotionally Abused: No    Physically Abused: No    Sexually Abused: No    Review of Systems  Respiratory:  Positive for apnea.   Psychiatric/Behavioral:  Positive for sleep disturbance.     Vitals:   03/07/23 1256  BP: 134/79  Pulse: 77  SpO2: 97%     Physical Exam Constitutional:      Appearance: She is obese.  HENT:     Head: Normocephalic.     Nose: Nose normal.     Mouth/Throat:     Mouth: Mucous membranes are moist.     Comments: Mallampati 3 Eyes:     General: No scleral icterus.    Pupils: Pupils are equal, round, and reactive to light.  Cardiovascular:     Rate and Rhythm: Normal rate and regular rhythm.     Heart sounds:     No friction rub.   Pulmonary:     Effort: No respiratory distress.     Breath sounds: No stridor. No wheezing or rhonchi.  Musculoskeletal:     Cervical back: No rigidity or tenderness.  Neurological:     Mental Status: She is alert.  Psychiatric:        Mood and Affect: Mood normal.        No data to display         Epworth Sleepiness Scale of 12  Data Reviewed: Previous sleep study not available to be reviewed  Assessment:  History of obstructive sleep apnea, sounds like he was severe when he was diagnosed previously -Did she did use CPAP for about a year and a half before discontinuing secondary to insurance issues -Does not recollect having significant difficulty tolerating CPAP previously  Worsening symptoms led to wanting to be evaluated again for sleep disordered breathing  Class III obesity  Pathophysiology of sleep disordered breathing was discussed with the patient Treatment options discussed with the patient   Plan/Recommendations: Will schedule the patient for home sleep test  Weight loss measures encouraged   We will update her with results once reviewed  Follow-up in about 3 months   Virl Diamond MD [facility] 03/07/2023, 12:57 PM  CC: Irena Reichmann, DO

## 2023-03-16 DIAGNOSIS — G4733 Obstructive sleep apnea (adult) (pediatric): Secondary | ICD-10-CM

## 2023-03-17 ENCOUNTER — Ambulatory Visit (HOSPITAL_COMMUNITY)
Admission: RE | Admit: 2023-03-17 | Discharge: 2023-03-17 | Disposition: A | Discharge status: Home / Self Care | Payer: 59 | Source: Ambulatory Visit | Attending: Hematology | Admitting: Hematology

## 2023-03-17 DIAGNOSIS — Z9049 Acquired absence of other specified parts of digestive tract: Secondary | ICD-10-CM | POA: Diagnosis not present

## 2023-03-17 DIAGNOSIS — R911 Solitary pulmonary nodule: Secondary | ICD-10-CM

## 2023-03-17 DIAGNOSIS — I10 Essential (primary) hypertension: Secondary | ICD-10-CM | POA: Insufficient documentation

## 2023-03-17 DIAGNOSIS — R008 Other abnormalities of heart beat: Secondary | ICD-10-CM | POA: Insufficient documentation

## 2023-03-17 DIAGNOSIS — E119 Type 2 diabetes mellitus without complications: Secondary | ICD-10-CM | POA: Diagnosis not present

## 2023-03-17 DIAGNOSIS — G4730 Sleep apnea, unspecified: Secondary | ICD-10-CM | POA: Insufficient documentation

## 2023-03-17 DIAGNOSIS — I2699 Other pulmonary embolism without acute cor pulmonale: Secondary | ICD-10-CM | POA: Diagnosis present

## 2023-03-17 LAB — ECHOCARDIOGRAM COMPLETE
AR max vel: 1.96 cm2
AV Area VTI: 2.34 cm2
AV Area mean vel: 1.83 cm2
AV Mean grad: 3 mm[Hg]
AV Peak grad: 4.9 mm[Hg]
Ao pk vel: 1.11 m/s
Area-P 1/2: 2.35 cm2
Calc EF: 57.6 %
MV VTI: 2.82 cm2
S' Lateral: 2.4 cm
Single Plane A2C EF: 59.9 %
Single Plane A4C EF: 54.8 %

## 2023-03-17 NOTE — Progress Notes (Signed)
  Echocardiogram 2D Echocardiogram has been performed.  Ocie Doyne RDCS 03/17/2023, 9:33 AM

## 2023-03-29 ENCOUNTER — Telehealth: Payer: Self-pay | Admitting: Pulmonary Disease

## 2023-03-29 NOTE — Telephone Encounter (Signed)
 Call patient  Sleep study result  Date of study: 03/16/2023  Impression: Mild obstructive sleep apnea with mild oxygen desaturations AHI of 13.3  Recommendation: Options of treatment for mild obstructive sleep apnea will include  1.  CPAP therapy if there is significant daytime sleepiness or other comorbidities including history of CVA or cardiac disease -If CPAP is chosen as an option of treatment auto titrating CPAP with a pressure setting of 5-15 will be appropriate  2.  Watchful waiting with emphasis on weight loss measures, sleep position modification to optimize lateral sleep, elevating the head of the bed by about 30 degrees may also help.  3.  An oral device may be fashioned for the treatment of mild sleep disordered breathing, will involve referral to dentist.   Follow-up as previously scheduled

## 2023-04-05 NOTE — Telephone Encounter (Signed)
 Atc pt no answer, lvmm for pt to give the office a call back

## 2023-04-07 ENCOUNTER — Other Ambulatory Visit: Payer: Self-pay

## 2023-04-07 DIAGNOSIS — I2699 Other pulmonary embolism without acute cor pulmonale: Secondary | ICD-10-CM

## 2023-04-08 ENCOUNTER — Inpatient Hospital Stay: Payer: 59 | Attending: Hematology

## 2023-04-08 ENCOUNTER — Inpatient Hospital Stay: Payer: 59 | Admitting: Hematology

## 2023-05-16 ENCOUNTER — Ambulatory Visit: Payer: BC Managed Care – PPO | Admitting: Pulmonary Disease

## 2023-05-19 ENCOUNTER — Telehealth: Admitting: Pulmonary Disease

## 2023-05-19 DIAGNOSIS — G4733 Obstructive sleep apnea (adult) (pediatric): Secondary | ICD-10-CM | POA: Diagnosis not present

## 2023-05-19 DIAGNOSIS — Z7901 Long term (current) use of anticoagulants: Secondary | ICD-10-CM | POA: Diagnosis not present

## 2023-05-19 NOTE — Progress Notes (Signed)
 Virtual Visit via Video Note  I connected with Brittany Giles on 05/19/23 at  9:30 AM EST by a video enabled telemedicine application and verified that I am speaking with the correct person using two identifiers.  Location: Patient: Patient was at home Provider: [address] in the office   I discussed the limitations of evaluation and management by telemedicine and the availability of in person appointments. The patient expressed understanding and agreed to proceed.  History of Present Illness: Patient with mild obstructive sleep apnea diagnosed on recent sleep study  Recently had pulmonary embolism for which she remains on anticoagulation  Breathing is getting better  Functioning well, working on weight loss  Feels well overall  We did discuss findings on recent sleep study showing an AHI of 13.3, mild oxygen desaturations  She has tried CPAP previously   Observations/Objective:  She does look well,  Sleep study shows AHI of 13.3 with O2 nadir of 90%  Assessment and Plan:  Mild obstructive sleep apnea with mild oxygen desaturations Recent pulmonary embolism for which she remains on Eliquis  Obesity -She is on Mounjaro and this is helping weight loss  DME referral for auto CPAP CPAP settings of 5-15 with heated humidification with patient's mask of choice   Follow Up Instructions: Follow-up in about 2 to 3 months  Encouraged to give [place] a call with any significant concerns   I discussed the assessment and treatment plan with the patient. The patient was provided an opportunity to ask questions and all were answered. The patient agreed with the plan and demonstrated an understanding of the instructions.   The patient was advised to call back or seek an in-person evaluation if the symptoms worsen or if the condition fails to improve as anticipated.  I provided 22 minutes of non-face-to-face time during this encounter.   Tomma Lightning, MD

## 2023-05-19 NOTE — Patient Instructions (Signed)
 Mild sleep apnea  We will contact your medical supply company to set you up with CPAP  DME referral for auto CPAP settings of 5-15 with heated humidification with patient's mask of choice Nasal mask/nasal pillows  Follow-up within 2 to 3 months

## 2023-05-24 ENCOUNTER — Telehealth: Payer: Self-pay | Admitting: Pulmonary Disease

## 2023-05-30 NOTE — Telephone Encounter (Signed)
 fax confirmation received 05/30/23

## 2023-06-10 NOTE — Telephone Encounter (Signed)
 Pt had a video visit with Dr. Val Eagle 3/6. Closing encounter.

## 2023-06-25 ENCOUNTER — Other Ambulatory Visit: Payer: Self-pay | Admitting: Hematology

## 2023-07-18 ENCOUNTER — Inpatient Hospital Stay: Attending: Hematology | Admitting: Hematology

## 2023-07-18 DIAGNOSIS — I2699 Other pulmonary embolism without acute cor pulmonale: Secondary | ICD-10-CM | POA: Diagnosis present

## 2023-07-18 DIAGNOSIS — Z7901 Long term (current) use of anticoagulants: Secondary | ICD-10-CM | POA: Diagnosis not present

## 2023-07-22 NOTE — Progress Notes (Signed)
 HEMATOLOGY/ONCOLOGY TELE-MED VISIT NOTE  Date of Service: 07/18/2023  Patient Care Team: Giles, Dana, DO as PCP - General (Family Medicine)  CHIEF COMPLAINTS/PURPOSE OF CONSULTATION:  Evaluation and management of bilateral pulmonary embolism.  HISTORY OF PRESENTING ILLNESS:   Brittany Giles is a wonderful 53 y.o. female who has been referred to us  by Brittany Brand, DO for evaluation and management of bilateral pulmonary embolism.  She presented to the ED on 12/26/2022 for bilateral pulmonary embolism. CTA chest showed acute bilateral PE with CT evidence of right heart strain. She was requiring 4 L supplemental O2 via [place] on admission.   CT angio chest scan did show 8 mm lung nodule in left upper lobe  versus an area of developing infarct. There was no evidence of deep vein thrombosis in the lower extremities on recent US . She is currently on Eliquis .   Today, she is accompanied by her sister. Patient has a Hx of HTN, DM, and sleep apnea. She reports that she is a [employer].  Her symptoms began last Saturday when she felt short of breath once exiting her car. Her SOB presented suddenly. She continued to have breathing difficulties when walking her dog the next day and did sleep for most of that day.  She did endorse some chest discomfort at the time of her blood clot event, but did not have any significant pain. Patient did have headaches for two days at that time.  She reports that she generally endorses pain in her right side, which she calls her "weak side". She reports that she is unable to lie down due to right leg pain. This has been a chronic issue for several years, potentially related to arthritis. Patient therefore regularly sleeps in a recliner, causing her legs to swell.   Her bilateral leg swelling has been present prior to her SOB. She denies any new leg swelling or new pain in the legs. Patient has no pain in left leg. Her legs are swollen in the  mornings and does typically improve throughout the day. She has no leg swelling at this time.  She reports that she did have a syncope episode just before her hospitalization. Patient reports that after having a bowel movement, she could not wipe herself as she could not move her hands to wipe. Once she got up to wash her hands, she felt woozy and had a syncope episode.   She typically walks frequently and does not sit on the floor regularly. Patient denies any long distance travel or recent surgeries. No other new medications, hormonal exposures, or any other new changes. She is a never smoker.  She generally does experience leg cramps in the mornings. Patient reports that a couple weeks ago, she endorsed left leg cramping in her calf, which would not move. She reports that she tried stomping her leg and rubbing the area, and the cramp eventually resolved.   Patient will have her next mammogram this Saturday. She had her last cologuard test 1 year ago, which was negative.   Patient did have her ovaries and uterus removed due to multiple medical issues including abnormal bleeding and pre-eclampsia. She reports a hx of hiatal hernia repair and cholecystectomy.  Patient reports that she was taken off of Metformin  yesterday due to frequent diarrhea and was switched to Glipizide.   She generally drinks three 16-ounce bottles of water daily. Patient does not generally drink tea or coffee. Patient does use compression socks regularly.  She  reports that she does not have a CPAP machine at home though it had been recommended to her previously.   Patient has been on Ozempic  for 1 year, then stopped as it caused her to feel poorly with belching. Ozempic  did not cause weight loss.   In regards to family history, her mother did have lung cancer and she was found to have lupus anticoagulant just before her passing. Her mother was a smoker. Patient's grandmother did have breast cancer diagnosed in 78s. There  is no other fhx of cancer, blood disorders, or bleeding disorders.   She denies any unexpected weight loss or other localized symptoms. No other new symptoms in the last 6 months. No major medication changes in the last 6 months.  INTERVAL HISTORY:  Brittany Giles is a wonderful 53 y.o. female who is being connected with via telemedicine visit for continued evaluation and management of bilateral pulmonary embolism. I last had a phone visit with patient on 01/18/2023 and she complained of red/dry eyes, and right leg dull ache.   I connected with Brittany Giles on 07/18/2023 at  3:30 PM EDT by telephone visit and verified that I am speaking with the correct person using two identifiers.   I discussed the limitations, risks, security and privacy concerns of performing an evaluation and management service by telemedicine and the availability of in-person appointments. I also discussed with the patient that there may be a patient responsible charge related to this service. The patient expressed understanding and agreed to proceed.   Other persons participating in the visit and their role in the encounter: none   Patient's location: home  Provider's location: [facility]   Chief Complaint: bilateral pulmonary embolism     Patient was called to follow-up on her status with her anticoagulation for bilateral pulmonary embolism.  Patient notes no acute new symptoms. Labs from January 2025, CT chest without contrast and echocardiogram were reviewed with her in details.  MEDICAL HISTORY:  Past Medical History:  Diagnosis Date   Anxiety    Depression    Diabetes mellitus without complication (HCC)    type 2   Hypertension    OSA on CPAP     SURGICAL HISTORY: Past Surgical History:  Procedure Laterality Date   ABDOMINAL HYSTERECTOMY     BREAST BIOPSY Left    2010 benign   CESAREAN SECTION  2011   CHOLECYSTECTOMY      SOCIAL HISTORY: Social History   Socioeconomic History    Marital status: Married    Spouse name: Not on file   Number of children: Not on file   Years of education: Not on file   Highest education level: Not on file  Occupational History   Not on file  Tobacco Use   Smoking status: Never   Smokeless tobacco: Never  Substance and Sexual Activity   Alcohol use: Not Currently   Drug use: Not Currently   Sexual activity: Not on file  Other Topics Concern   Not on file  Social History Narrative   Not on file   Social Drivers of Health   Financial Resource Strain: Not on file  Food Insecurity: No Food Insecurity (12/26/2022)   Hunger Vital Sign    Worried About Running Out of Food in the Last Year: Never true    Ran Out of Food in the Last Year: Never true  Transportation Needs: No Transportation Needs (12/26/2022)   PRAPARE - Transportation    Lack of Transportation (  Medical): No    Lack of Transportation (Non-Medical): No  Physical Activity: Not on file  Stress: Not on file  Social Connections: Not on file  Intimate Partner Violence: Not At Risk (12/26/2022)   Humiliation, Afraid, Rape, and Kick questionnaire    Fear of Current or Ex-Partner: No    Emotionally Abused: No    Physically Abused: No    Sexually Abused: No    FAMILY HISTORY: Family History  Problem Relation Age of Onset   Breast cancer Maternal Grandmother 65    ALLERGIES:  has no known allergies.  MEDICATIONS:  Current Outpatient Medications  Medication Sig Dispense Refill   Accu-Chek Softclix Lancets lancets Use as directed to check blood sugar in the morning, at noon, and at bedtime. 100 each 0   acetaminophen  (TYLENOL ) 500 MG tablet Take 2 tablets (1,000 mg total) by mouth every 6 (six) hours as needed for mild pain (pain score 1-3), fever or headache (or Fever >/= 101). 30 tablet 0   APIXABAN  (ELIQUIS ) VTE STARTER PACK (10MG  AND 5MG ) Take as directed on package: start with two-5mg  tablets twice daily for 7 days. On day 8, switch to one-5mg  tablet twice  daily. (Patient not taking: Reported on 05/19/2023) 74 each 0   Blood Glucose Monitoring Suppl (BLOOD GLUCOSE MONITOR SYSTEM) w/Device KIT Use as directed in the morning, at noon, and at bedtime. 1 kit 0   Blood Glucose Monitoring Suppl DEVI Use 3 (three) times daily. May dispense any manufacturer covered by patient's insurance. 1 each 0   citalopram  (CELEXA ) 20 MG tablet Take 20 mg by mouth daily.     ELIQUIS  5 MG TABS tablet TAKE 1 TABLET BY MOUTH TWICE DAILY START  AFTER  COMPLETION  OF  ELIQUIS   STARTER  PACK 60 tablet 0   Glucose Blood (BLOOD GLUCOSE TEST STRIPS) STRP Use  3 (three) times daily. Use as directed to check blood sugar. May dispense any manufacturer covered by patient's insurance and fits patient's device. 100 strip 0   insulin  glargine (LANTUS ) 100 UNIT/ML Solostar Pen Inject 20 Units into the skin daily. 15 mL 0   Insulin  Pen Needle (PEN NEEDLES) 31G X 5 MM MISC Use as directed 3 times daily with insulin . 100 each 0   Insulin  Pen Needle (PEN NEEDLES) 32G X 5 MM MISC Use daily as directed 100 each 0   Lancet Device MISC Use 3 (three) times daily. May dispense any manufacturer covered by patient's insurance. 1 each 0   lisinopril  (ZESTRIL ) 20 MG tablet Take 1 tablet (20 mg total) by mouth daily. 30 tablet 0   MOUNJARO 2.5 MG/0.5ML Pen Inject 2.5 mg into the skin once a week.     Semaglutide -Weight Management 0.5 MG/0.5ML SOAJ Inject 0.5 mg into the skin once a week. 2 mL 0   No current facility-administered medications for this visit.    REVIEW OF SYSTEMS:    10 Point review of Systems was done is negative except as noted above.   PHYSICAL EXAMINATION: TELE-MED VISIT  LABORATORY DATA:  I have reviewed the data as listed  .    Latest Ref Rng & Units 01/06/2023   10:08 AM 12/28/2022    8:03 AM 12/27/2022    7:27 PM  CBC  WBC 4.0 - 10.5 K/uL 5.6  5.2  5.1   Hemoglobin 12.0 - 15.0 g/dL 30.8  65.7  84.6   Hematocrit 36.0 - 46.0 % 36.1  41.0  40.4   Platelets 150 - 400  K/uL 333  239  232     .    Latest Ref Rng & Units 01/06/2023   10:08 AM 12/29/2022    4:58 AM 12/28/2022    8:03 AM  CMP  Glucose 70 - 99 mg/dL 629  528  413   BUN 6 - 20 mg/dL 17  22  17    Creatinine 0.44 - 1.00 mg/dL 2.44  0.10  2.72   Sodium 135 - 145 mmol/L 139  138  136   Potassium 3.5 - 5.1 mmol/L 3.8  4.0  3.8   Chloride 98 - 111 mmol/L 104  100  102   CO2 22 - 32 mmol/L 26  27  24    Calcium 8.9 - 10.3 mg/dL 9.3  9.5  9.3   Total Protein 6.5 - 8.1 g/dL 7.9     Total Bilirubin 0.3 - 1.2 mg/dL 0.5     Alkaline Phos 38 - 126 U/L 65     AST 15 - 41 U/L 25     ALT 0 - 44 U/L 29        RADIOGRAPHIC STUDIES: I have personally reviewed the radiological images as listed and agreed with the findings in the report.  CT chest without contrast 03/17/2023- IMPRESSION: 1. No acute pulmonary process. 2. No pulmonary lesions or pulmonary nodules. The left upper lobe nodular lesion has resolved and was likely an area of inflammation or atelectasis. 3. No mediastinal or hilar mass or adenopathy. 4. Status post cholecystectomy.   Echo 03/17/2023 IMPRESSIONS     1. Left ventricular ejection fraction, by estimation, is 55 to 60%. The  left ventricle has normal function. The left ventricle has no regional  wall motion abnormalities. There is mild left ventricular hypertrophy.  Left ventricular diastolic parameters  were normal.   2. Right ventricular systolic function is normal. The right ventricular  size is normal.   3. The mitral valve is normal in structure. No evidence of mitral valve  regurgitation. No evidence of mitral stenosis.   4. The aortic valve is tricuspid. Aortic valve regurgitation is not  visualized. No aortic stenosis is present.   5. The inferior vena cava is normal in size with greater than 50%  respiratory variability, suggesting right atrial pressure of 3 mmHg.    ASSESSMENT & PLAN:   53 y.o. female with:  Bilateral pulmonary embolism -no clear  provoking event. .  And right heart strain on admission and required 4 L of oxygen by nasal cannula.  Currently on room air.      Negative hypercoagulability workup Morbid Obesity   PLAN:  -03/17/2023 ECHO shows LVEF 55 to 60% .  Resolution of right heart strain. -03/17/2023 CT chest scan without contrast shows: 1. No acute pulmonary process. 2. No pulmonary lesions or pulmonary nodules. The left upper lobe nodular lesion has resolved and was likely an area of inflammation or atelectasis. 3. No mediastinal or hilar mass or adenopathy. 4. Status post cholecystectomy. - Previous hypercoagulable showed no overt explanation for her bilateral pulmonary embolism. - Patient with extensive bilateral PEs with right heart strain and hypoxia and the fact that there was not a definite explanation for her extensive blood clots at this time we recommend doing long-term anticoagulation with Eliquis . - Patient is agreeable to this and will continue to follow with her primary care physician for long-term anticoagulation. - Discussed importance of healthy weight loss with diet and exercise and potentially pharmacologic treatments with her primary care physician  FOLLOW-UP: RTC with PCP  The total time spent in the appointment was 20 minutes* .  All of the patient's questions were answered with apparent satisfaction. The patient knows to call the clinic with any problems, questions or concerns.   Jacquelyn Matt MD MS AAHIVMS [facility] Hematology/Oncology Physician [facility]  .*Total Encounter Time as defined by the Centers for Medicare and Medicaid Services includes, in addition to the face-to-face time of a patient visit (documented in the note above) non-face-to-face time: obtaining and reviewing outside history, ordering and reviewing medications, tests or procedures, care coordination (communications with other health care professionals or caregivers) and documentation in the medical record.     I,Mitra Faeizi,acting as a Neurosurgeon for Jacquelyn Matt, MD.,have documented all relevant documentation on the behalf of Jacquelyn Matt, MD,as directed by  Jacquelyn Matt, MD while in the presence of Jacquelyn Matt, MD.  .I have reviewed the above documentation for accuracy and completeness, and I agree with the above. .Elvis Boot Kishore Constantin Hillery MD

## 2023-07-25 ENCOUNTER — Other Ambulatory Visit: Payer: Self-pay | Admitting: Hematology

## 2023-08-24 ENCOUNTER — Other Ambulatory Visit: Payer: Self-pay | Admitting: Hematology

## 2023-09-09 ENCOUNTER — Ambulatory Visit
Admission: RE | Admit: 2023-09-09 | Discharge: 2023-09-09 | Disposition: A | Discharge status: Home / Self Care | Source: Ambulatory Visit | Attending: Family Medicine

## 2023-09-09 VITALS — BP 129/81 | HR 78 | Temp 98.8°F | Resp 16

## 2023-09-09 DIAGNOSIS — M79662 Pain in left lower leg: Secondary | ICD-10-CM | POA: Diagnosis not present

## 2023-09-09 NOTE — ED Triage Notes (Addendum)
 Pt present with c/o lt leg swelling and weakness starting at the top of her leg down to feet. Pt states she had a PE in Oct of 2024.

## 2023-09-09 NOTE — ED Provider Notes (Addendum)
 [facility]    CSN: [id number] Arrival date & time: 09/09/23  0805      History   Chief Complaint Chief Complaint  Patient presents with   Extremity Pain    I am currently taking Eliquis . I had a pulmonary embolism in October of last year. My left leg is tender to the touch with a burning sensation. I had an initial pain in my groan area, then a uncomfortable burning sensation in the back of my left thig - Entered by patient    HPI Brittany Giles is a 53 y.o. female presents for leg pain. Pt has a PMH of anxiety, depression, diabetes, hypertension, OSA, pulmonary embolus, presents for leg pain.  Patient reports 2 days ago she developed some left groin pain with left posterior thigh burning/pain.  States that is now resolved she then developed some left medial anterior knee pain that extends into her left shin and to her left heel.  States that is greatly improved since yesterday.  She reports tenderness to touch but no pain with walking or at rest.  She states she normally has swelling of her lower legs that she controls with compression stockings.  Denies any increase in swelling.  Denies any calf pain.  States it felt warm to her but denies any visible redness.  No injury but does states symptoms began after doing water aerobics.  No chest pain or shortness of breath.  Reports she has chronic low back pain but nothing outside of her norm.  She did have a bilateral pulmonary emboli in October 2024 with no known cause.  States she had no other symptoms prior to onset of her shortness of breath at that time.  She is on Eliquis  but states that since that happened she is concerned about any symptoms.  She did take some Tylenol  as well that helped with her symptoms.  No other concerns at this time.   Extremity Pain    Past Medical History:  Diagnosis Date   Anxiety    Depression    Diabetes mellitus without complication (HCC)    type 2   Hypertension    OSA on CPAP      Patient Active Problem List   Diagnosis Date Noted   Bilateral pulmonary embolism (HCC) 12/26/2022   Acute respiratory failure with hypoxia (HCC) 12/26/2022   Type 2 diabetes mellitus (HCC) 12/26/2022   Hypertension associated with diabetes (HCC) 12/26/2022   Hypokalemia 12/26/2022   OSA (obstructive sleep apnea)     Past Surgical History:  Procedure Laterality Date   ABDOMINAL HYSTERECTOMY     BREAST BIOPSY Left    2010 benign   CESAREAN SECTION  2011   CHOLECYSTECTOMY      OB History   No obstetric history on file.      Home Medications    Prior to Admission medications   Medication Sig Start Date End Date Taking? Authorizing Provider  Accu-Chek Softclix Lancets lancets Use as directed to check blood sugar in the morning, at noon, and at bedtime. 12/29/22   Sheikh, Alejandro Latif, DO  acetaminophen  (TYLENOL ) 500 MG tablet Take 2 tablets (1,000 mg total) by mouth every 6 (six) hours as needed for mild pain (pain score 1-3), fever or headache (or Fever >/= 101). 12/29/22   Sheikh, Omair Latif, DO  Blood Glucose Monitoring Suppl (BLOOD GLUCOSE MONITOR SYSTEM) w/Device KIT Use as directed in the morning, at noon, and at bedtime. 12/29/22   Sherrill Alejandro Donovan,  DO  Blood Glucose Monitoring Suppl DEVI Use 3 (three) times daily. May dispense any manufacturer covered by patient's insurance. 12/29/22   Sherrill Cable Latif, DO  citalopram  (CELEXA ) 20 MG tablet Take 20 mg by mouth daily.    [provider]  ELIQUIS  5 MG TABS tablet TAKE 1 TABLET BY MOUTH TWICE DAILY START  AFTER  COMPLETION  OF  ELIQUIS   STARTER  PACK 08/24/23   Onesimo Emaline Brink, MD  Glucose Blood (BLOOD GLUCOSE TEST STRIPS) STRP Use  3 (three) times daily. Use as directed to check blood sugar. May dispense any manufacturer covered by patient's insurance and fits patient's device. 12/29/22   Sherrill Cable Latif, DO  insulin  glargine (LANTUS ) 100 UNIT/ML Solostar Pen Inject 20 Units into the skin daily.  12/29/22   Sherrill Cable Latif, DO  Insulin  Pen Needle (PEN NEEDLES) 31G X 5 MM MISC Use as directed 3 times daily with insulin . 12/29/22   Sheikh, Omair Latif, DO  Insulin  Pen Needle (PEN NEEDLES) 32G X 5 MM MISC Use daily as directed 12/29/22   Sheikh, Omair Latif, DO  Lancet Device MISC Use 3 (three) times daily. May dispense any manufacturer covered by patient's insurance. 12/29/22   Sherrill Cable Latif, DO  lisinopril  (ZESTRIL ) 20 MG tablet Take 1 tablet (20 mg total) by mouth daily. 12/29/22   Sheikh, Omair Latif, DO  MOUNJARO 2.5 MG/0.5ML Pen Inject 2.5 mg into the skin once a week. 01/04/23   [provider]  Semaglutide -Weight Management 0.5 MG/0.5ML SOAJ Inject 0.5 mg into the skin once a week. 12/29/22   Sherrill Cable Donovan, DO    Family History Family History  Problem Relation Age of Onset   Breast cancer Maternal Grandmother 45    Social History Social History   Tobacco Use   Smoking status: Never   Smokeless tobacco: Never  Substance Use Topics   Alcohol use: Not Currently   Drug use: Not Currently     Allergies   Metformin  hcl   Review of Systems Review of Systems  Musculoskeletal:        Lower leg pain      Physical Exam Triage Vital Signs ED Triage Vitals  Encounter Vitals Group     BP 09/09/23 0826 129/81     Girls Systolic BP Percentile --      Girls Diastolic BP Percentile --      Boys Systolic BP Percentile --      Boys Diastolic BP Percentile --      Pulse Rate 09/09/23 0826 78     Resp 09/09/23 0826 16     Temp 09/09/23 0826 98.8 F (37.1 C)     Temp Source 09/09/23 0826 Oral     SpO2 09/09/23 0826 97 %     Weight --      Height --      Head Circumference --      Peak Flow --      Pain Score 09/09/23 0825 0     Pain Loc --      Pain Education --      Exclude from Growth Chart --    No data found.  Updated Vital Signs BP 129/81 (BP Location: Left Arm)   Pulse 78   Temp 98.8 F (37.1 C) (Oral)   Resp 16   SpO2 97%    Visual Acuity Right Eye Distance:   Left Eye Distance:   Bilateral Distance:    Right Eye Near:   Left  Eye Near:    Bilateral Near:     Physical Exam Vitals and nursing note reviewed.  Constitutional:      General: She is not in acute distress.    Appearance: Normal appearance. She is not ill-appearing.  HENT:     Head: Normocephalic and atraumatic.   Eyes:     Pupils: Pupils are equal, round, and reactive to light.    Cardiovascular:     Rate and Rhythm: Normal rate.  Pulmonary:     Effort: Pulmonary effort is normal.   Musculoskeletal:       Legs:     Comments: There is no swelling, ecchymosis, erythema, warmth of the left knee or lower leg.  No pitting edema.  There is mild tenderness with palpation to the left medial knee that extends to left anterior shin.  There is also mild tenderness to the left heel without erythema warmth or swelling.  No tenderness to plantar fascia or to dorsum of foot.  No tenderness to thigh.  No tenderness palpation along the posterior calf.  Strength is 5 out of 5 bilateral lower extremities.  Left calf measures 41 cm, right calf measures 40.5 cm.   Skin:    General: Skin is warm and dry.   Neurological:     General: No focal deficit present.     Mental Status: She is alert and oriented to person, place, and time.   Psychiatric:        Mood and Affect: Mood normal.        Behavior: Behavior normal.     Clinical feature Score     Active cancer (treatment ongoing or within the previous six months or palliative) 1  Paralysis, paresis, or recent plaster immobilization of the lower extremities 1  Recently bedridden for more than three days or major surgery, within four weeks                                                    1  Localized tenderness along the distribution of the deep venous system 1  Entire leg swollen 1  Calf swelling by more than 3 cm when compared to the asymptomatic leg (measured below tibial tuberosity) 1   Pitting edema (greater in the symptomatic leg) 1  Collateral superficial veins (nonvaricose) 1  Alternative diagnosis as likely or more likely than that of deep venous thrombosis -2                                                                                                                                                             Total Score 0  Interpretation    High probability  3 or greater  Moderate probability 1 or 2  Low probability 0 or less  Modification   This clinical model has been modified to take one other clinical feature into account: a previously documented deep vein thrombosis (DVT) is given the score of 1. Using this modified scoring system, DVT is either likely or unlikely, as follows:  DVT likely 2 or greater   DVT unlikely 1 or less    UC Treatments / Results  Labs (all labs ordered are listed, but only abnormal results are displayed) Labs Reviewed - No data to display  EKG   Radiology No results found.  Procedures Procedures (including critical care time)  Medications Ordered in UC Medications - No data to display  Initial Impression / Assessment and Plan / UC Course  I have reviewed the triage vital signs and the nursing notes.  Pertinent labs & imaging results that were available during my care of the patient were reviewed by me and considered in my medical decision making (see chart for details).     Reviewed exam and symptoms with patient.  Patient reports initially left groin/anterior thigh pain 2 days ago that resolved.  Now reports improving left anterior medial knee, shin pain, and heel pain.  States is improved with Tylenol  and using her compression stockings.  There is no warmth, erythema or tenderness along the posterior calf.  No lower leg welling.  She has no chest pain or shortness of breath.  She is currently on Eliquis .  Discussed with patient Wells score shows low probability for DVT.  I did discuss that I am unable to rule out  any type of vascular occlusion/disorder in this setting with 100% certainty.  We did discuss ER evaluation but she declined at this time.  Advised patient to closely monitor her symptoms and she may continue Tylenol  as needed.  She states she has an appoint with her PCP in 2 weeks and will follow-up with them.  I did instruct her to go to the ER for any worsening symptoms, red flags reviewed and she verbalized understanding. Final Clinical Impressions(s) / UC Diagnoses   Final diagnoses:  Pain of left lower leg     Discharge Instructions      You may continue Tylenol  as needed.  Please monitor your symptoms closely and if you notice any worsening symptoms please go to the ER ASAP.  This includes but is not limited to chest pain, shortness of breath, worsening pain in your leg, swelling, redness or warmth, or any new concerns that arise.  Follow-up with your PCP at your scheduled appointment.  I hope you feel better soon!     ED Prescriptions   None    PDMP not reviewed this encounter.   Loreda Myla SAUNDERS, NP 09/09/23 [id number]    Loreda Myla SAUNDERS, NP 09/09/23 [phone]

## 2023-09-09 NOTE — Discharge Instructions (Addendum)
 You may continue Tylenol  as needed.  Please monitor your symptoms closely and if you notice any worsening symptoms please go to the ER ASAP.  This includes but is not limited to chest pain, shortness of breath, worsening pain in your leg, swelling, redness or warmth, or any new concerns that arise.  Follow-up with your PCP at your scheduled appointment.  I hope you feel better soon!

## 2023-09-23 ENCOUNTER — Other Ambulatory Visit: Payer: Self-pay | Admitting: Hematology

## 2024-01-06 ENCOUNTER — Other Ambulatory Visit (HOSPITAL_COMMUNITY): Payer: Self-pay | Admitting: Family Medicine

## 2024-01-06 DIAGNOSIS — R0602 Shortness of breath: Secondary | ICD-10-CM

## 2024-01-06 DIAGNOSIS — Z86711 Personal history of pulmonary embolism: Secondary | ICD-10-CM

## 2024-01-06 DIAGNOSIS — R079 Chest pain, unspecified: Secondary | ICD-10-CM
